# Patient Record
Sex: Female | Born: 1985 | Race: White | Hispanic: No | Marital: Single | Smoking: Current every day smoker
Health system: Southern US, Community
[De-identification: ages and names within clinical notes are randomized; demographics above are authoritative.]

## PROBLEM LIST (undated history)

## (undated) ENCOUNTER — Emergency Department (HOSPITAL_COMMUNITY): Discharge status: Absent without leave | Payer: Medicare Other

## (undated) DIAGNOSIS — I10 Essential (primary) hypertension: Secondary | ICD-10-CM

## (undated) DIAGNOSIS — F319 Bipolar disorder, unspecified: Secondary | ICD-10-CM

## (undated) DIAGNOSIS — G4730 Sleep apnea, unspecified: Secondary | ICD-10-CM

## (undated) DIAGNOSIS — E7800 Pure hypercholesterolemia, unspecified: Secondary | ICD-10-CM

## (undated) HISTORY — PX: TONSILLECTOMY: SUR1361

---
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
## 2004-02-03 ENCOUNTER — Inpatient Hospital Stay (HOSPITAL_COMMUNITY): Admission: AD | Admit: 2004-02-03 | Discharge: 2004-02-10 | Payer: Self-pay | Admitting: Psychiatry

## 2010-01-19 ENCOUNTER — Emergency Department (HOSPITAL_COMMUNITY): Admission: EM | Admit: 2010-01-19 | Discharge: 2010-01-21 | Payer: Self-pay | Admitting: Emergency Medicine

## 2010-11-15 ENCOUNTER — Emergency Department (HOSPITAL_COMMUNITY): Payer: Medicare Other

## 2010-11-15 ENCOUNTER — Inpatient Hospital Stay (HOSPITAL_COMMUNITY)
Admission: EM | Admit: 2010-11-15 | Discharge: 2010-11-18 | DRG: 683 | Disposition: A | Discharge status: Home / Self Care | Payer: Medicare Other | Attending: Internal Medicine | Admitting: Internal Medicine

## 2010-11-15 DIAGNOSIS — E785 Hyperlipidemia, unspecified: Secondary | ICD-10-CM | POA: Diagnosis present

## 2010-11-15 DIAGNOSIS — T438X5A Adverse effect of other psychotropic drugs, initial encounter: Secondary | ICD-10-CM | POA: Diagnosis present

## 2010-11-15 DIAGNOSIS — N179 Acute kidney failure, unspecified: Principal | ICD-10-CM | POA: Diagnosis present

## 2010-11-15 DIAGNOSIS — F17200 Nicotine dependence, unspecified, uncomplicated: Secondary | ICD-10-CM | POA: Diagnosis present

## 2010-11-15 DIAGNOSIS — T360X5A Adverse effect of penicillins, initial encounter: Secondary | ICD-10-CM | POA: Diagnosis present

## 2010-11-15 DIAGNOSIS — N390 Urinary tract infection, site not specified: Secondary | ICD-10-CM | POA: Diagnosis present

## 2010-11-15 DIAGNOSIS — L270 Generalized skin eruption due to drugs and medicaments taken internally: Secondary | ICD-10-CM | POA: Diagnosis present

## 2010-11-15 DIAGNOSIS — K5289 Other specified noninfective gastroenteritis and colitis: Secondary | ICD-10-CM | POA: Diagnosis present

## 2010-11-15 DIAGNOSIS — D72829 Elevated white blood cell count, unspecified: Secondary | ICD-10-CM | POA: Diagnosis present

## 2010-11-15 DIAGNOSIS — E860 Dehydration: Secondary | ICD-10-CM | POA: Diagnosis present

## 2010-11-15 DIAGNOSIS — R7402 Elevation of levels of lactic acid dehydrogenase (LDH): Secondary | ICD-10-CM | POA: Diagnosis present

## 2010-11-15 DIAGNOSIS — E8881 Metabolic syndrome: Secondary | ICD-10-CM | POA: Diagnosis present

## 2010-11-15 DIAGNOSIS — I959 Hypotension, unspecified: Secondary | ICD-10-CM | POA: Diagnosis present

## 2010-11-15 DIAGNOSIS — R7401 Elevation of levels of liver transaminase levels: Secondary | ICD-10-CM | POA: Diagnosis present

## 2010-11-15 DIAGNOSIS — F319 Bipolar disorder, unspecified: Secondary | ICD-10-CM | POA: Diagnosis present

## 2010-11-15 DIAGNOSIS — Z79899 Other long term (current) drug therapy: Secondary | ICD-10-CM

## 2010-11-15 LAB — POCT I-STAT, CHEM 8
Calcium, Ion: 1.17 mmol/L (ref 1.12–1.32)
Glucose, Bld: 92 mg/dL (ref 70–99)
HCT: 34 % — ABNORMAL LOW (ref 36.0–46.0)
Hemoglobin: 11.6 g/dL — ABNORMAL LOW (ref 12.0–15.0)
Potassium: 5.4 mEq/L — ABNORMAL HIGH (ref 3.5–5.1)

## 2010-11-15 LAB — HEPATIC FUNCTION PANEL
AST: 79 U/L — ABNORMAL HIGH (ref 0–37)
Bilirubin, Direct: 0.2 mg/dL (ref 0.0–0.3)
Indirect Bilirubin: 0.6 mg/dL (ref 0.3–0.9)
Total Protein: 5.9 g/dL — ABNORMAL LOW (ref 6.0–8.3)

## 2010-11-15 LAB — BASIC METABOLIC PANEL
BUN: 27 mg/dL — ABNORMAL HIGH (ref 6–23)
CO2: 24 mEq/L (ref 19–32)
Chloride: 106 mEq/L (ref 96–112)
Creatinine, Ser: 2.56 mg/dL — ABNORMAL HIGH (ref 0.4–1.2)
GFR calc Af Amer: 27 mL/min — ABNORMAL LOW (ref >60)

## 2010-11-15 LAB — CARBAMAZEPINE LEVEL, TOTAL: Carbamazepine Lvl: 3.2 ug/mL — ABNORMAL LOW (ref 4.0–12.0)

## 2010-11-15 LAB — URINALYSIS, ROUTINE W REFLEX MICROSCOPIC
Ketones, ur: 15 mg/dL — AB
Nitrite: POSITIVE — AB
Urobilinogen, UA: 1 mg/dL (ref 0.0–1.0)

## 2010-11-15 LAB — CBC
Hemoglobin: 10.4 g/dL — ABNORMAL LOW (ref 12.0–15.0)
MCH: 30 pg (ref 26.0–34.0)
MCV: 93.1 fL (ref 78.0–100.0)
RBC: 3.47 MIL/uL — ABNORMAL LOW (ref 3.87–5.11)

## 2010-11-15 LAB — DIFFERENTIAL
Lymphocytes Relative: 17 % (ref 12–46)
Lymphs Abs: 2.4 10*3/uL (ref 0.7–4.0)
Monocytes Relative: 10 % (ref 3–12)
Neutro Abs: 8.4 10*3/uL — ABNORMAL HIGH (ref 1.7–7.7)
Neutrophils Relative %: 58 % (ref 43–77)

## 2010-11-15 LAB — POCT PREGNANCY, URINE: Preg Test, Ur: NEGATIVE

## 2010-11-16 ENCOUNTER — Emergency Department (HOSPITAL_COMMUNITY): Payer: Medicare Other

## 2010-11-16 DIAGNOSIS — F319 Bipolar disorder, unspecified: Secondary | ICD-10-CM

## 2010-11-16 LAB — LITHIUM LEVEL: Lithium Lvl: 1.58 mEq/L (ref 0.80–1.40)

## 2010-11-16 LAB — BASIC METABOLIC PANEL
BUN: 19 mg/dL (ref 6–23)
Chloride: 106 mEq/L (ref 96–112)
GFR calc non Af Amer: 46 mL/min — ABNORMAL LOW (ref >60)
Glucose, Bld: 108 mg/dL — ABNORMAL HIGH (ref 70–99)
Potassium: 4.9 mEq/L (ref 3.5–5.1)

## 2010-11-16 LAB — CBC
HCT: 33.1 % — ABNORMAL LOW (ref 36.0–46.0)
MCV: 92.5 fL (ref 78.0–100.0)
RDW: 14.3 % (ref 11.5–15.5)
WBC: 12.2 10*3/uL — ABNORMAL HIGH (ref 4.0–10.5)

## 2010-11-16 LAB — HEPATITIS PANEL, ACUTE
HCV Ab: NEGATIVE
Hep A IgM: NEGATIVE
Hepatitis B Surface Ag: NEGATIVE

## 2010-11-16 LAB — CARDIAC PANEL(CRET KIN+CKTOT+MB+TROPI)
CK, MB: 0.5 ng/mL (ref 0.3–4.0)
Relative Index: INVALID (ref 0.0–2.5)
Total CK: 16 U/L (ref 7–177)
Troponin I: 0.02 ng/mL (ref 0.00–0.06)

## 2010-11-16 LAB — CK TOTAL AND CKMB (NOT AT ARMC)
Relative Index: INVALID (ref 0.0–2.5)
Total CK: 14 U/L (ref 7–177)

## 2010-11-16 NOTE — H&P (Signed)
NAMEABBYGAEL, CURTISS NO.:  [id number]  MEDICAL RECORD NO.:  [id number]           PATIENT TYPE:  E  LOCATION:  [facility]                         FACILITY:  [facility]  PHYSICIAN:  Erick Blinks, MD     DATE OF BIRTH:  Nov 14, 1985  DATE OF ADMISSION:  11/15/2010 DATE OF DISCHARGE:                             HISTORY & PHYSICAL   PRIMARY CARE PHYSICIAN:  [facility] in [place], [place].  CHIEF COMPLAINT:  Persistent nausea and vomiting.  HISTORY OF PRESENT ILLNESS:  This is a 25 year old female with past medical history of bipolar disorder, depression, morbid obesity as well as tobacco abuse who presents to the emergency room with complaints of 2- week history of nausea, vomiting, and diarrhea.  The patient reports that approximately 2 weeks ago, she was in her normal state of health when she developed onset of persistent nausea with vomiting.  She vomits approximately 2-3 times a day.  She reports that she throws up even when she does not eat any food. The vomitus is yellow in color without any blood.  She also reports having diarrhea, mostly occurring at night, having 5-6 episodes at night.  This has also been for the past week. She denies any abdominal pain.  She reports that her urine is dark in color, but has been having an adequate amount of urine output.  She denies any dysuria.  She has a chronic cough which has not really changed.  She has no shortness of breath.  She denies any fever, changes in vision, headache, confusion, somnolence.  She does have increased fatigue and decreased p.o. intake.  She reports feeling dizzy when standing up.  Denies any chest pain or palpitations.  She is a resident of a group home and due to the constellation of the symptoms, she was sent to the ER for evaluation.  In the emergency room, the patient was found to be in acute renal failure with a creatinine of 3.2.  She was started on IV fluids and had a  significant improvement with this.  Her creatinine has improved to 1.38.  She was monitored overnight in the CDU with hopes of discharge today after aggressive hydration, but due to the persistence of her symptoms, as well as an elevated lithium level of 1.7, the patient has been referred for admission.  Of note, she has also been persistently hypotensive during her stay here in the emergency room with blood pressures ranging between the 80s to 90s.  PAST MEDICAL HISTORY: 1. Bipolar disorder. 2. Hyperlipidemia. 3. Depression. 4. Tobacco abuse. 5. Morbid obesity.  ALLERGIES:  No known drug allergies.  MEDICATIONS PRIOR TO ADMISSION: 1. BuSpar. 2. Celexa. 3. Lipitor. 4. Lithium 5. Tegretol. 6. Trazodone. 7. Unknown blood pressure medication.  FAMILY HISTORY:  The patient's mother and father are alive and have no significant past medical history.  SOCIAL HISTORY:  The patient smokes about 7 cigarettes a day.  She denies any alcohol or drug abuse.  She lives at a group home.  REVIEW OF SYSTEMS:  All systems have been reviewed and pertinent positives stated in the  HPI.  PHYSICAL EXAMINATION:  VITAL SIGNS:  Temperature 97, blood pressure 98/56, heart rate of 85, respiratory rate of 18, pulse ox is 98% on room air. GENERAL:  The patient in no acute distress, lying comfortably in bed. HEENT:  Normocephalic, atraumatic.  Pupils are equal, round, reactive to light without any evidence of nystagmus. NECK:  Supple. CHEST:  Clear to auscultation bilaterally. CARDIAC:  S1 and S2 with a slight tachycardia. ABDOMEN:  Obese, soft, nontender. EXTREMITIES:  No significant pedal edema.  There is a petechial rash on the lower extremities involving the shins and the feet bilaterally.  The patient reports having this for approximately greater than 2 weeks now. NEUROLOGIC:  The patient has equal strength bilaterally.  Cranial nerves II through XII are grossly intact.  Plantars are  downgoing.  LABORATORY DATA:  Lithium level of 1.7.  Sodium 135, potassium 4.9, chloride 106, bicarb 22, glucose 108, BUN 19, creatinine 1.38, calcium 9.2, WBC 12.2, hemoglobin 10.7, platelet count 275, lactic acid of 1.4. Urinalysis shows 11-20 wbc's and many bacteria.  Urine pregnancy test is negative.  Tegretol level was 3.2.  Lipase is normal at 23.  Liver enzymes slightly elevated with an ALT of 162, AST of 79, alkaline phos is 165.  Liver ultrasound shows no abdominal pathology was demonstrated on an abdominal ultrasound.  ASSESSMENT AND PLAN: 1. Lithium toxicity. 2. Acute renal failure, improving. 3. Urinary tract infection. 4. Hypotension with question underlying sepsis. 5. Bipolar. 6. Morbid obesity. 7. Tobacco abuse. 8. Hyperlipidemia. 9. Leukocytosis. 10.Transaminitis. 11.Full code.  PLAN: 1. We will admit the patient to the step-down unit due to her    low blood pressure.  She will likely benefit from being     monitored there for at least 24 hours to make sure her blood     pressure is stable after which she can be transferred to the floor.     We will continue her with IV hydration.  The emergency room     physicians have contacted the renal service and it is felt that     patient does not require dialysis at this time.  This was also     discussed with Poison Control who agree that dialysis is not     emergently needed, but a lithium level should be monitored,     therefore we will repeat one in the morning.  Regarding her urinary     tract infection, we will treat her empirically with Rocephin and     follow up on urine culture.  Her nausea and vomiting is likely     secondary to her lithium levels.  We will keep her on a regular     diet and provide antiemetics.  Her hypotension may also be related     to her lithium level, although infection may be playing a role here     as well.  We will also check a TSH as well as serum cortisol level. 2. Transaminitis.   We will repeat levels in the morning, but this is     likely secondary to hypotension and acute illness.  Her abdominal     ultrasound is negative, but we will check an acute hepatitis panel     in light of her nausea and vomiting. 3. Leukocytosis, again possibly stress-related versus secondary to     urinary tract infection.  We will also check chest x-ray to rule     out any underlying infection.  For tobacco  abuse, we will ask for     tobacco cessation consult.  Regarding her bipolar medications, we     will hold her lithium due to toxicity and we will ask Psychiatry to     see her in consultation since she may need to be started on a new     agent in place of lithium.  The patient is a full code.  Further     orders will be per the clinical course.     Erick Blinks, MD     JM/MEDQ  D:  11/16/2010  T:  11/16/2010  Job:  [id number]  Electronically Signed by Durward Mallard MEMON  on 11/16/2010 07:33:50 PM

## 2010-11-17 DIAGNOSIS — F319 Bipolar disorder, unspecified: Secondary | ICD-10-CM

## 2010-11-17 LAB — COMPREHENSIVE METABOLIC PANEL
BUN: 16 mg/dL (ref 6–23)
CO2: 24 mEq/L (ref 19–32)
Calcium: 9.4 mg/dL (ref 8.4–10.5)
Creatinine, Ser: 0.95 mg/dL (ref 0.4–1.2)
GFR calc non Af Amer: >60 mL/min (ref >60)
Glucose, Bld: 134 mg/dL — ABNORMAL HIGH (ref 70–99)

## 2010-11-17 LAB — GLUCOSE, CAPILLARY

## 2010-11-17 LAB — RAPID URINE DRUG SCREEN, HOSP PERFORMED
Amphetamines: NOT DETECTED
Barbiturates: NOT DETECTED
Opiates: NOT DETECTED

## 2010-11-17 LAB — LIPID PANEL
Total CHOL/HDL Ratio: 7.5 RATIO
Triglycerides: 215 mg/dL — ABNORMAL HIGH (ref <150)
VLDL: 43 mg/dL — ABNORMAL HIGH (ref 0–40)

## 2010-11-17 LAB — CBC
HCT: 31.2 % — ABNORMAL LOW (ref 36.0–46.0)
Hemoglobin: 10 g/dL — ABNORMAL LOW (ref 12.0–15.0)
MCH: 29.9 pg (ref 26.0–34.0)
MCHC: 32.1 g/dL (ref 30.0–36.0)
MCV: 93.1 fL (ref 78.0–100.0)

## 2010-11-17 LAB — CORTISOL: Cortisol, Plasma: 1.3 ug/dL

## 2010-11-17 LAB — CARDIAC PANEL(CRET KIN+CKTOT+MB+TROPI)
CK, MB: 0.5 ng/mL (ref 0.3–4.0)
Total CK: 17 U/L (ref 7–177)
Troponin I: 0.03 ng/mL (ref 0.00–0.06)

## 2010-11-17 NOTE — Consult Note (Signed)
[name], Jessica Rios NO.:  [id number]  MEDICAL RECORD NO.:  [id number]           PATIENT TYPE:  E  LOCATION:  [facility]                         FACILITY:  [facility]  PHYSICIAN:  Anselm Jungling, MD  DATE OF BIRTH:  1986-02-11  DATE OF CONSULTATION:  11/16/2010 DATE OF DISCHARGE:                                CONSULTATION   IDENTIFICATION DATA AND REASON FOR REFERRAL:  The patient is a 25 year old single Caucasian female, currently under the care of Dr. Effie Shy here at [facility] Emergency Department.  Psychiatric consultation is requested to assess mental status and make recommendations regarding her medication for bipolar disorder.  HISTORY OF THE PRESENTING PROBLEMS:  The following information is obtained from the medical record and from the patient Rios, who was able to provide some useful information.  The patient was admitted with a chief complaint of nausea, vomiting, and diarrhea.  She has subsequently been found to have leukocytosis, hypotension, lithium toxicity with a level of 1.75, possibly urinary tract infection and rule out sepsis.  She also has some transaminitis. There is a history of morbid obesity and acute renal failure.  Jessica Rios tells me that she lives in a group home, and is on a regimen of psychotropic medications that include BuSpar, trazodone, lithium carbonate, and Celexa.  She states that the psychiatrist that prescribes these as a new one to her and she does not know the doctor's name.  However, she indicates that she has been taking her medications as directed.  (At the time of this dictation, we are awaiting medication reconciliation regarding the specific doses of these above medications).  The patient indicates that she has had a history of psychiatric hospitalization on many different occasions in her life, but the last time she was hospitalized within the [facility] was probably more than 7 or 8 years ago.  She  indicates that recently she has been stable with respect to her bipolar disorder, without any symptoms of mood disturbance, or psychosis.  Although she currently lives in a certain group home, she is apparently given 2 weeks notice there and will be moving to a different facility in the near future.  Her mother is apparently local and is supportive.  MENTAL STATUS AND OBSERVATIONS:  The patient is a morbidly obese, red- headed young woman, who I interviewed in her emergency room bed.  She is alert, fully oriented, and in full contact with reality.  She was pleasant, cooperative.  Mood appears to be neutral with appropriate affect.  Thoughts and speech are normally organized.  There is nothing to suggest any active psychosis, thought disorder, or cognitive disturbance.  She was able to provide me the names, but not the doses of her psychotropic medications.  She indicated that her mood is neutral recently and she has no complaints regarding symptoms of bipolar disorder, either cognitive, behavioral, or affective.  She smiled appropriately at times, and interacted with me and entirely rational, conventional, and appropriate fashion.  IMPRESSIONS:  Axis I:  Bipolar affective disorder, currently euthymic without psychotic features. Axis II:  Deferred. Axis III:  Multiple  medical problems, lithium toxicity. Axis IV:  Stressors severe. Axis V:  GAF 65.  RECOMMENDATIONS:  At this time, I agree with the current hold on her lithium, until the etiology of her lithium toxicity can be determined and her lithium level drops below the toxic range.  Most likely, her lithium toxicity is consequence of her dehydration and associated electrolyte disturbance, which in turn may be related to acute medical conditions including infection.  For now, it might be reasonable to hold BuSpar, Celexa, and trazodone, although they are not likely to be a problem in terms of her other current medical  conditions.  It would make sense to get her precise psychotropic medication regimen recorded into the medical record.  Then, when her medical problems are stabilized with respect to renal function, lithium level, hydration, electrolyte balance, etc., consideration can be given to reinitiating her psychotropic regimen.  Psychiatry will be happy to follow her during her hospital course.  Thank you for referring this most interesting patient.     Anselm Jungling, MD     SPB/MEDQ  D:  11/16/2010  T:  11/16/2010  Job:  [id number]  Electronically Signed by Geralyn Flash MD on 11/17/2010 10:37:37 AM

## 2010-11-18 LAB — COMPREHENSIVE METABOLIC PANEL
AST: 44 U/L — ABNORMAL HIGH (ref 0–37)
BUN: 9 mg/dL (ref 6–23)
CO2: 24 mEq/L (ref 19–32)
Calcium: 9.3 mg/dL (ref 8.4–10.5)
Chloride: 109 mEq/L (ref 96–112)
Creatinine, Ser: 0.9 mg/dL (ref 0.4–1.2)
GFR calc Af Amer: >60 mL/min (ref >60)
GFR calc non Af Amer: >60 mL/min (ref >60)
Glucose, Bld: 101 mg/dL — ABNORMAL HIGH (ref 70–99)
Total Bilirubin: 0.5 mg/dL (ref 0.3–1.2)

## 2010-11-18 LAB — CBC
Hemoglobin: 10.4 g/dL — ABNORMAL LOW (ref 12.0–15.0)
MCH: 30.1 pg (ref 26.0–34.0)
MCHC: 32.2 g/dL (ref 30.0–36.0)
MCV: 93.6 fL (ref 78.0–100.0)

## 2010-11-18 LAB — DIFFERENTIAL
Basophils Relative: 2 % — ABNORMAL HIGH (ref 0–1)
Lymphs Abs: 3.3 10*3/uL (ref 0.7–4.0)
Monocytes Absolute: 1.7 10*3/uL — ABNORMAL HIGH (ref 0.1–1.0)
Monocytes Relative: 12 % (ref 3–12)
Neutro Abs: 6.6 10*3/uL (ref 1.7–7.7)

## 2010-11-18 LAB — URINE CULTURE
Colony Count: 35000
Culture  Setup Time: 201202172206

## 2010-11-18 LAB — GLUCOSE, CAPILLARY
Glucose-Capillary: 113 mg/dL — ABNORMAL HIGH (ref 70–99)
Glucose-Capillary: 132 mg/dL — ABNORMAL HIGH (ref 70–99)

## 2010-11-28 NOTE — Discharge Summary (Signed)
Jessica Rios, Jessica Rios                ACCOUNT NO.:  [id number]  MEDICAL RECORD NO.:  [id number]           PATIENT TYPE:  I  LOCATION:  3705                         FACILITY:  [facility]  PHYSICIAN:  Erick Blinks, MD     DATE OF BIRTH:  [birth year]  DATE OF ADMISSION:  11/15/2010 DATE OF DISCHARGE:                        DISCHARGE SUMMARY - REFERRING   PRIMARY CARE PHYSICIAN:  [facility] in [place].  DISCHARGE DIAGNOSES: 1. Dehydration, improved. 2. Nausea, vomiting, and diarrhea possibly viral gastroenteritis,     resolved. 3. Acute renal failure secondary to nausea, resolved with IV fluids. 4. Urinary tract infection. 5. Lithium toxicity, improved. 6. History of bipolar disorder. 7. Allergic dermatitis secondary to recent penicillin use. 8. Leukocytosis secondary to allergies. 9. Transaminitis possibly secondary to hypotension, improving. 10.Morbid obesity. 11.Metabolic syndrome.  DISCHARGE MEDICATIONS: 1. Ciprofloxacin 500 mg p.o. b.i.d. for 3 more days. 2. Prednisone taper. 3. BuSpar 15 mg 1 tablet by mouth 3 times a day. 4. Carbamazepine XR 100 mg 1 tablet by mouth twice daily. 5. Celexa 40 mg 1 tablet by mouth daily. 6. Lithium carbonate CR 450 mg tablets, 2.5 tablets by mouth daily at     bedtime. 7. Meclizine 25 mg 1-2 tablets by mouth 3 times a day as needed. 8. Metformin XR 500 mg 1 tablet by mouth twice daily. 9. Vitamin B12 1000 mcg 1 tablet by mouth daily.  MEDICATIONS STOPPED IN THE HOSPITAL: 1. Lisinopril 10 mg daily secondary to renal failure. 2. Lipitor 20 mg daily secondary to elevated liver enzymes.  ADMISSION HISTORY:  This is a 25 year old female who is a resident of group home who was brought to the emergency room with complaints of nausea, vomiting, and diarrhea.  Here in the emergency room, she was found to be in acute renal failure and had an elevated lithium level. She was also found to be hypotensive in the 70s-80s.  She  was aggressively hydrated with IV fluids and was admitted to the hospital for observation.  For further details, please refer to history and physical dictated on February 16.  HOSPITAL COURSE: 1. Acute renal failure.  This was secondary to dehydration and has     improved back to baseline with aggressive IV hydration. 2. Dehydration, possibly secondary to viral gastroenteritis.  The     patient is currently tolerating p.o.  She is not having any further     nausea, vomiting, or diarrhea.  Her blood pressure is still     normotensive to little bit on the lower side and therefore we would     not restart her lisinopril and for this concern, would be revisited     as an outpatient. 3. Elevated liver enzymes.  The patient had liver ultrasound which was     negative.  She also had a negative hepatitis panel.  This may     possibly due to hypotension.  Her liver enzymes are currently     trending down and can be further followed as an outpatient.  We     will not restart her Lipitor until her liver enzymes have  normalized and should be rechecked in the next week with her     primary care doctor. 4. Bipolar disorder.  The patient was seen in consultation by Dr.     Electa Sniff.  It was felt that the current regimen of her medication     should be continued and no changes were made in her psychiatric     medication. 5. Allergic dermatitis.  The patient was found to have diffuse rash on     her legs as well as her arms.  She notices since she has been     taking her penicillin that was prescribed by an ophthalmologist.     Her leukocytosis is likely secondary to her rash with an elevated     eosinophil differential.  We will give her a course of prednisone     and she can have further followup with primary care physician.  CONSULTATIONS:  Psychiatry, Dr. Electa Sniff.  PROCEDURES:  None.  DIAGNOSTIC IMAGING: 1. Ultrasound of the abdomen on February 15 shows no abdominal     pathology was  demonstrated. 2. Chest x-ray from February 16 shows cardiomegaly, bronchitic change.  DISCHARGE INSTRUCTIONS:  The patient should continue on a low-calorie diet, conduct her activity as tolerated.  She will need to follow up with her primary care physician within next 1 week and have repeat blood work including liver function tests as well as renal function.  She will also follow up with psychologist/psychiatrist as an outpatient.  She will be discharged to a group home and will be transported by her family.  The patient is otherwise stable for discharge at this point.     Erick Blinks, MD     JM/MEDQ  D:  11/18/2010  T:  11/18/2010  Job:  [id number]  Electronically Signed by Durward Mallard Mareesa Gathright  on 11/28/2010 05:51:10 PM

## 2010-12-07 ENCOUNTER — Emergency Department: Payer: Self-pay

## 2010-12-15 ENCOUNTER — Emergency Department (HOSPITAL_BASED_OUTPATIENT_CLINIC_OR_DEPARTMENT_OTHER)
Admission: EM | Admit: 2010-12-15 | Discharge: 2010-12-15 | Disposition: A | Discharge status: Home / Self Care | Payer: Medicare Other | Attending: Emergency Medicine | Admitting: Emergency Medicine

## 2010-12-15 DIAGNOSIS — Z79899 Other long term (current) drug therapy: Secondary | ICD-10-CM | POA: Insufficient documentation

## 2010-12-15 DIAGNOSIS — L03319 Cellulitis of trunk, unspecified: Secondary | ICD-10-CM | POA: Insufficient documentation

## 2010-12-15 DIAGNOSIS — F17200 Nicotine dependence, unspecified, uncomplicated: Secondary | ICD-10-CM | POA: Insufficient documentation

## 2010-12-15 DIAGNOSIS — L02219 Cutaneous abscess of trunk, unspecified: Secondary | ICD-10-CM | POA: Insufficient documentation

## 2010-12-15 DIAGNOSIS — F319 Bipolar disorder, unspecified: Secondary | ICD-10-CM | POA: Insufficient documentation

## 2010-12-19 LAB — CBC
HCT: 42.4 % (ref 36.0–46.0)
Hemoglobin: 14.2 g/dL (ref 12.0–15.0)
MCV: 92.2 fL (ref 78.0–100.0)
WBC: 19.5 10*3/uL — ABNORMAL HIGH (ref 4.0–10.5)

## 2010-12-19 LAB — URINALYSIS, ROUTINE W REFLEX MICROSCOPIC
Glucose, UA: NEGATIVE mg/dL
Ketones, ur: NEGATIVE mg/dL
Leukocytes, UA: NEGATIVE
Nitrite: NEGATIVE
Protein, ur: 30 mg/dL — AB
pH: 5.5 (ref 5.0–8.0)

## 2010-12-19 LAB — COMPREHENSIVE METABOLIC PANEL
Alkaline Phosphatase: 56 U/L (ref 39–117)
BUN: 8 mg/dL (ref 6–23)
Chloride: 107 mEq/L (ref 96–112)
Creatinine, Ser: 1.04 mg/dL (ref 0.4–1.2)
GFR calc non Af Amer: >60 mL/min (ref >60)
Glucose, Bld: 124 mg/dL — ABNORMAL HIGH (ref 70–99)
Potassium: 3.7 mEq/L (ref 3.5–5.1)
Total Bilirubin: 0.5 mg/dL (ref 0.3–1.2)

## 2010-12-19 LAB — PREGNANCY, URINE: Preg Test, Ur: NEGATIVE

## 2010-12-19 LAB — URINE MICROSCOPIC-ADD ON

## 2010-12-19 LAB — POCT I-STAT, CHEM 8
Calcium, Ion: 1.16 mmol/L (ref 1.12–1.32)
Chloride: 107 mEq/L (ref 96–112)
HCT: 46 % (ref 36.0–46.0)
Potassium: 3.8 mEq/L (ref 3.5–5.1)
Sodium: 142 mEq/L (ref 135–145)

## 2010-12-19 LAB — RAPID URINE DRUG SCREEN, HOSP PERFORMED
Barbiturates: NOT DETECTED
Benzodiazepines: NOT DETECTED

## 2010-12-19 LAB — DIFFERENTIAL
Basophils Absolute: 0.1 10*3/uL (ref 0.0–0.1)
Basophils Relative: 0 % (ref 0–1)
Lymphocytes Relative: 21 % (ref 12–46)
Neutro Abs: 14.1 10*3/uL — ABNORMAL HIGH (ref 1.7–7.7)
Neutrophils Relative %: 72 % (ref 43–77)

## 2010-12-19 LAB — ETHANOL: Alcohol, Ethyl (B): <5 mg/dL (ref 0–10)

## 2010-12-19 LAB — ACETAMINOPHEN LEVEL: Acetaminophen (Tylenol), Serum: <10 ug/mL — ABNORMAL LOW (ref 10–30)

## 2011-01-26 ENCOUNTER — Emergency Department: Payer: Self-pay | Admitting: Emergency Medicine

## 2011-01-31 ENCOUNTER — Inpatient Hospital Stay: Payer: Self-pay | Admitting: Psychiatry
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
## 2011-02-16 NOTE — Discharge Summary (Signed)
NAMECLYDENE, BURACK NO.:  [id number]   MEDICAL RECORD NO.:  [id number]                   PATIENT TYPE:  IPS   LOCATION:  0302                                 FACILITY:  [facility]   PHYSICIAN:  Jeanice Lim, M.D.              DATE OF BIRTH:  14-Apr-1986   DATE OF ADMISSION:  12/01/2002  DATE OF DISCHARGE:  12/06/2002                                 DISCHARGE SUMMARY   IDENTIFYING DATA:  This is an 25 year old Caucasian female, married,  voluntarily admitted with a history of post partum depression.  She was  hospitalized at [facility] for six days February 17 with suicidal thoughts  and agitation at that time.  Parents took her back due to her not improving  and had her stop her medications three days after discharge from the  hospital, mother feeling that these medicines maybe making her worse.  Also,  mother and the patient felt that an IUD, which had been inserted, may be  causing many of her symptoms.   MEDICATIONS:  The patient had been discharged from [facility] on:  1. Klonopin.  2. Lexapro.  3. Risperdal.   DRUG ALLERGIES:  EFFEXOR, which causes her to act strange.   PHYSICAL EXAMINATION:  GENERAL: Essentially within normal limits.  NEUROLOGIC: Nonfocal.   LABORATORY DATA:  Routine admission labs were essentially within normal  limits.  CBC was normal except for mildly elevated white count of 10.9.  TSH  was 0.55.   MENTAL STATUS EXAM:  obese, agitated, mildly sedated appearing female, quite  tearful, convinced IUD was causing physical problems, having episodic panic  attacks, focused on having the IUD removed, episodically anxious and  agitated, hyperventilating, describing depressive symptoms, panic attacks,  and some disordered thinking with possible somatic delusions.  Cognitive:  Intact.  Judgment and insight: Fair.   ADMISSION DIAGNOSES:   AXIS I:  1. Major depressive disorder, recurrent, severe with psychotic features.  2.  Polysubstance abuse in remission.   AXIS II:  None.   AXIS III:  Two months post partum.   AXIS IV:  Moderate problems with primary support group and stress related to  pregnancy and marriage.   AXIS V:  20/60   HOSPITAL COURSE:  The patient was admitted, ordered routine p.r.n.  medications, underwent further monitoring, and was encouraged to participate  in individual, group, and milieu therapy.  Family was involved in treatment  plan and aftercare planning.  The patient was educated regarding the  importance of her compliance with medications and the time period necessary  for an adequate therapeutic trial.  She was resumed on Klonopin and  Risperdal was optimized, targeting psychotic symptoms and agitated  depression.  Lexapro was also optimized.  The patient reported a positive  response to medications and reported no longer wanting the IUD removed,  realizing that she had depression which was treatable if she  would stay on  the medications.   CONDITION ON DISCHARGE:  Her condition on discharge was markedly improved.  Mood was more euthymic.  Affect: Brighter.  Thought processes: Goal  directed.  Thought content: Negative for dangerous ideation or psychotic  symptoms.  The patient reported motivation to be compliant with the  aftercare plan.   DISCHARGE MEDICATIONS:  1. Lexapro 20 mg q.a.m.  2. Risperdal 2 mg q.h.s.  3. Remeron 15 mg q.h.s.   FOLLOW UP:  The patient was to follow up at [facility]  within seven days.   DISCHARGE DIAGNOSES:   AXIS I:  1. Major depressive disorder, recurrent, severe with psychotic features.  2. Polysubstance abuse in remission.   AXIS II:  None.   AXIS III:  Two months post partum.   AXIS IV:  Moderate problems with primary support group and stress related to  pregnancy and marriage.   AXIS V:  Global assessment of functioning on discharge was 55.                                                Jeanice Lim, M.D.    JEM/MEDQ  D:  12/15/2002  T:  12/15/2002  Job:  [id number]

## 2011-02-16 NOTE — H&P (Signed)
[facility]  Patient:    Jessica Rios, Jessica Rios                     MRN: [id number] Adm. Date:  [id number] Attending:  Jasmine Pang CC:         [facility]                   Psychiatric Admission Assessment  IDENTIFICATION:  Almost 25 year old Caucasian female referred by the [facility].  HISTORY OF PRESENT ILLNESS:  Patient has a history of mood disorder with an escalation of depression.  She has become increasingly anergic with anhedonia, difficulty concentrating, feelings of hopelessness and worthlessness and suicidal ideation.  On the day of admission she threatened to overdose on Neurontin or run away.  She has had an overdose on medications within the past several months.  PAST PSYCHIATRIC HISTORY:  Patient was seen at [facility].  Her diagnoses were bipolar disorder and depression, according to patient.  She is on Neurontin 600 mg p.o. t.i.d.  She has been hospitalized at [facility] x 1.  She has been on the [place] psychiatric unit x 2.  She has been at [facility] x 1.  SUBSTANCE ABUSE HISTORY:  Patient uses marijuana daily.  She uses alcohol occasionally.  PAST MEDICAL HISTORY:  There were no medical problems.  ALLERGIES:  Patient states she is allergic to [facility] ("makes me go crazy"); however, there are no other known drug allergies.  MEDICATIONS:  She is on no medications other than Neurontin.  SOCIAL HISTORY:  Patient lives with her mother, father, and 30 year old sister.  She is in the 9th grade and states she is making Cs or above.  She denies a history of physical or sexual abuse.  FAMILY PSYCHIATRIC HISTORY:  Negative as reported upon admission.  This will be further evaluated by our social worker.  LEGAL HISTORY:  None.  ADMISSION MENTAL STATUS EXAMINATION:  Patient presented as a reserved Caucasian female with poor eye contact.  Speech was soft and slow.  There  was no articulation disorder.  There was psychomotor retardation.  Mood was depressed and irritable, affect sad, constricted, and consistent with mood.  There was positive suicidal ideation as per history of present illness.  There was no homicidal ideation.  There was no psychosis or perceptual disturbance. Thought processes were logical and goal directed.  Thought content revealed no predominant theme.  Cognitive exam revealed that she was alert and oriented to person, place, time, and reason for being in the hospital.  Short term and long term memory were adequate.  General fund of knowledge was age and education level appropriate.  Attention and concentration were poor as assessed by her distractability.  Insight was minimal, judgment poor.  ADMISSION DIAGNOSES:   AXIS I:  1. Bipolar disorder, depressed.            2. Conduct disorder, adolescent onset.  AXIS II:  Deferred. AXIS III:  Healthy except for moderate obesity.  AXIS IV:  Severe.   AXIS V:  GAF is 10.  PATIENT ASSETS AND STRENGTHS:  Patient is healthy and able to be physically active.  Patient has a supportive family.  Patient has a supportive mental health center treatment team.  PROBLEM:  Mood instability with suicidal ideation.  SHORT TERM TREATMENT GOAL:  Resolution of suicidal ideation.  LONG TERM TREATMENT GOAL:  Resolution of mood instability.  INITIAL PLAN  OF CARE:  Patient will continue Neurontin, will also begin Celexa 10 mg p.o. q.h.s. tonight and 20 mg p.o. q.h.s. to address her depressive symptoms.  Patient will also be involved in unit therapeutic groups and activities.  Patient will be involved in family therapy.  Estimated length of inpatient treatment 5-7 days.  CONDITION NECESSARY FOR DISCHARGE:  Not suicidal.  INITIAL DISCHARGE PLANS:  Patient will return home to live with her family. Follow-up therapy and medication management will be at the [facility]. DD:   02/01/00 TD:  02/04/00 Job: [id number]

## 2011-02-16 NOTE — Discharge Summary (Signed)
[facility]  Patient:    Jessica Rios, Jessica Rios                     MRN: [id number] Adm. Date:  [id number] Disc. Date: [id number] Attending:  Jasmine Pang Dictator:   Carolanne Grumbling, M.D.                           Discharge Summary  PATIENT IDENTIFICATION:  Jessica Rios is a 25 year old female.  INITIAL ASSESSMENT AND DIAGNOSIS:  Jessica Rios was admitted to the service of Dr. Milford Cage.  Jessica Rios had a history of mood instability with an excalation of her depression.  Jessica Rios had become increasingly sad with feelings of loss of energy and loss of interest.  Jessica Rios had trouble concentrating, feelings of hopelessness, worthlessness, and had begun having suicidal thoughts.  On the day of admission, Jessica Rios threatened to take an overdose of Neurontin or to run away.  Jessica Rios had taken an overdose of medications within the last few months.  MENTAL STATUS:  Mental status at the time of the initial evaluation revealed an alert, oriented young woman who made poor eye contact.  Jessica Rios seemed sad and depressed.  Jessica Rios was positive for suicidal ideation per history.  There was no evidence of any psychosis.  Short and long-term memory were intact.  Attention was poor.  Jessica Rios seemed to be at least average intelligence.  Insight was minimal and judgment was poor.  Other pertinent history can be obtained from the psychosocial service summary.  PHYSICAL EXAMINATION:  Physical examination was noncontributory.  ADMITTING DIAGNOSES:   Axis I:    1. Bipolar disorder, depressed.              2. Conduct disorder, adolescent onset.   Axis II:   Deferred.   Axis III:  Healthy except for moderate obesity.   Axis IV:   Severe.   Axis X:    10.  Rios COURSE:  While in the Rios, Jessica Rios initially maintained Jessica Rios did not know why Jessica Rios was here.  Consequently, there was nothing to really work on. Jessica Rios said Jessica Rios did get into arguments with her mother at times, but did not see that as a major issue.  Jessica Rios denied  any suicidal thoughts and could not see that that was a topic worth discussing since Jessica Rios was not having the thoughts. Jessica Rios did have a session with her mother.  They were able to work things out so that at least the rules were clear.  The expectations and consequences were clear.  Jessica Rios, by that time, was saying that Jessica Rios realized that Jessica Rios did have an attitude problem and Jessica Rios needed to work things out with her mother and follow the rules to gain the trust.  Her mother was concerned particularly about the drug use and Jessica Rios admitted to smoking marijuana and said that Jessica Rios was willing to stop that, even though Jessica Rios was defensive in the family session about that, and her was also concerned about her endangering herself when Jessica Rios left the house.  CONDITION AT TIME OF DISCHARGE:  Jessica Rios was saying that Jessica Rios was planning to follow the rules, to not be sneaking out at night, to stop her marijuana use, and Jessica Rios denied any suicidal thoughts during the Rios stay and at the time of discharge.  POST Rios CARE PLANS:  Jessica Rios is referred back to her outpatient counselor, Mollie Germany and Dr. Chestine Spore at the [facility]  with an appointment with Dr. Chestine Spore on May 18.  DISCHARGE MEDICATIONS:  At the time of discharge he was taking: 1. Celexa 20 mg at bedtime. 2. Neurontin 600 mg three times daily.  There were no restrictions placed on her activity or her diet.  FINAL DIAGNOSES:   Axis I:    1. Bipolar disorder, depressed.              2. Conduct disorder, adolescent onset.              3. Polysubstance abuse.   Axis II:   No diagnosis.   Axis III:  Moderate obesity.   Axis IV:   Severe.   Axis X:    50. DD:  02/07/00 TD:  02/12/00 Job: [id number]

## 2011-02-16 NOTE — Discharge Summary (Signed)
NAMEMarland Rios  [name], [name] NO.:  [id number]   MEDICAL RECORD NO.:  [id number]                   PATIENT TYPE:  IPS   LOCATION:  0407                                 [facility]   PHYSICIAN:  Jeanice Lim, M.D.              DATE OF BIRTH:  [birth year]   DATE OF ADMISSION:  02/03/2004  DATE OF DISCHARGE:  02/10/2004                                 DISCHARGE SUMMARY   IDENTIFYING DATA:  This is a 25 year old married Caucasian female  involuntarily committed.  History of psychosis.  Recently discharged from  [facility].  Decompensating over the past three weeks, feeling  people were calling her a child molester, having trouble with children in  the neighborhood calling her names, wanting to beat her.  The patient may  have threatened her mother verbally as per commitment papers.  The patient  endorsed compliance with medications and visual hallucinations but she was  unable to described first admission to [facility].   PAST PSYCHIATRIC HISTORY:  Had been hospitalized at [facility] April of  2005.  No outpatient treatment.  No history of suicide attempts.  Has  therapist, [place], in [place].   PRIMARY CARE PHYSICIAN:  [facility].   MEDICATIONS:  Zoloft.   ALLERGIES:  No known drug allergies.   PHYSICAL EXAMINATION:  Within normal limits.  Neurologically nonfocal.   LABORATORY DATA:  Routine admission labs within normal limits.   MENTAL STATUS EXAM:  Alert, cooperative female with fair eye contact.  Disheveled.  Some thought-blocking.  Appeared anxious.  Thought processes  disorganized.  Some paranoid thinking.  Endorsing visual hallucinations.  Questionable auditory hallucinations.  Did appear to be responding to  internal stimulus.  Cognitively intact.  Judgment and insight poor and  impaired.  The patient was somewhat vague in being able to report specific  details of past, including the number of years  that she had been married,  which may have been related to thought disorder.   ADMISSION DIAGNOSES:   AXIS I:  1. Psychotic disorder not otherwise specified.  2. Rule out bipolar disorder, type 2, with psychotic features.   AXIS II:  Deferred.   AXIS III:  None.   AXIS IV:  Moderate (psychosocial stressors).   AXIS V:  25/60.   HOSPITAL COURSE:  The patient was admitted and ordered routine p.r.n.  medications and underwent further monitoring.  Was encouraged to participate  in individual, group and milieu therapy.  Was admitted involuntarily for  threatening behavior and psychotic symptoms as per commitment papers.  The  patient was ordered routine p.r.n. medications and stabilized on medications  targeting psychotic symptoms and recent mood instability.  She was continued  on Zoloft and then optimized on Geodon and Seroquel to restore sleep and  Geodon to target psychotic symptoms.  Depakote ER was then added to  stabilize mood and baseline was followed up  by contacting family members.  Depakote was further optimized along with Geodon and low-dose Klonopin for  acute anxiety.  Valproic acid level was monitored.  The patient participated  in aftercare planning and group therapy.   CONDITION ON DISCHARGE:  The patient was discharged to the mother in  improved condition with no dangerous ideation.  No psychotic symptoms.  Mood  more stable.  Improved judgment and insight.  Reported motivation to be  compliant with the aftercare plan and given medication education.   DISCHARGE MEDICATIONS:  1. Ambien 10 mg q.h.s. p.r.n.  2. Depakote ER 250 mg, 2 q.a.m., 1 at 4 p.m. and 4 q.h.s.  3. Geodon 60 mg b.i.d.  4. Seroquel 100 mg, 2 q.h.s.  5. Cogentin 1 mg q.6h. p.r.n. EPS.  6. Klonopin 0.5 mg, 1/2 q.a.m. and 1 at 6 p.m.   FOLLOW UP:  The patient was to follow up with Festus Barren Thursday, Feb 17, 2004 at 7:15 and at [facility].   DISCHARGE DIAGNOSES:   AXIS  I:  1. Psychotic disorder not otherwise specified.  2. Rule out bipolar disorder, type 2, with psychotic features.   AXIS II:  Deferred.   AXIS III:  None.   AXIS IV:  Moderate (psychosocial stressors).   AXIS V:  Global Assessment of Functioning on discharge 55.                                               Jeanice Lim, M.D.    JEM/MEDQ  D:  03/12/2004  T:  03/12/2004  Job:  [id number]

## 2011-02-16 NOTE — Discharge Summary (Signed)
[facility]  Patient:    Jessica Rios                     MRN: [id number] Adm. Date:  [id number] Disc. Date: [id number] Attending:  Jasmine Pang Dictator:   Carolanne Grumbling, M.D.                           Discharge Summary  PATIENT IDENTIFICATION:  Jessica Rios is a 25 year old female.  INITIAL ASSESSMENT AND DIAGNOSIS:  Jessica Rios was admitted to the service of Dr. Milford Cage.  Jessica Rios had had two recent admissions and this was the third in several months.  Jessica Rios stated Jessica Rios had been feeling very depressed, hopeless, and worthless.  Jessica Rios admitted to loss of energy, loss of interest, trouble concentrating.  Jessica Rios was upset because Jessica Rios mother would not let Jessica Rios see Jessica Rios boyfriend.  Jessica Rios also had some conflict with Jessica Rios friends and Jessica Rios took an overdose of Celexa.  MENTAL STATUS:  Mental status at the time of the initial evaluation revealed a cooperative but sullen young woman.  Jessica Rios was disheveled with poor eye contact. Jessica Rios looked depressed and sounded depressed.  Jessica Rios was also angry.  Jessica Rios was positive for suicidal ideation.  There was no evidence of any psychosis. Short- and long-term memory were intact.  Jessica Rios seemed to be at least average intelligence.  Concentration was adequate.  Insight was lacking.  Other pertinent history can be obtained from the psychosocial service summary of the first hospitalization.  PHYSICAL EXAMINATION:  Physical examination was noncontributory.  ADMITTING DIAGNOSES: Axis I:    1. Bipolar disorder, depressed.            2. Conduct disorder, adolescent onset. Axis II:   Deferred. Axis III:  Healthy. Axis IV:   Severe. Axis V:    10.  FINDINGS:   All indicated laboratory examinations were within normal limits or noncontributory.  HOSPITAL COURSE:  While in the hospital, Jessica Rios, once again, was fairly negativistic.  Jessica Rios admitted to taking the overdose but said Jessica Rios was not going to do that again and consequently Jessica Rios was ready to leave.   Jessica Rios insisted there was no reason to be in the hospital.  At other times Jessica Rios would say Jessica Rios was not telling the truth and there were other reasons why Jessica Rios was upset and then Jessica Rios would change the story apparently, or not tell the whole story.  For the most part though, Jessica Rios was oppositional and simply refusing to participate or do anything.  In Jessica Rios family session Jessica Rios continued to be oppositional towards Jessica Rios parents, yelling and screaming and cursing at them, and insisting that Jessica Rios leave and that the problem was that Jessica Rios was in the hospital, there were no other problems, Jessica Rios would go to school, Jessica Rios would not use drugs, Jessica Rios would stay away from Jessica Rios boyfriend, Jessica Rios would do everything Jessica Rios was supposed to do as long as Jessica Rios could be discharged.  As in Jessica Rios earlier hospitalizations, it seemed that Jessica Rios just did not understand things.  Jessica Rios could be contradictory and not realize it.  Jessica Rios seemed to not grasp that when you do certain things, other things happen as a consequence and that things in Jessica Rios life are very logical and followed a very logical course based on Jessica Rios behavior.  Jessica Rios, at times, did not seem to be wanting to be oppositional and defiant.  Jessica Rios just did not have any other way of processing  information to even try to manipulate Jessica Rios very well.  Jessica Rios would just simply refuse.  However, Jessica Rios did restart Jessica Rios Lithobid.  Jessica Rios had been on Lithobid in the past and reportedly had acne from it, but Jessica Rios believed that it was the most helpful of the medications Jessica Rios had taken before.  Jessica Rios mother reportedly believed Neurontin was best, but it was decided that Lithobid would be retried.  Perhaps it was the Lithobid, perhaps it was the fact that Jessica Rios just decided Jessica Rios was going to cooperate and get out of the hospital, but for whatever reason Jessica Rios did begin to behave better and was subsequently discharged.  Jessica Rios parents will continue to look for a longer term placement for Jessica Rios, because things at home just did not work  out, but for the moment Jessica Rios is returning home again.  At the time of discharge Jessica Rios was denying, as Jessica Rios had throughout the hospitalization, any suicidal thoughts.  POST HOSPITAL CARE PLANS:  Jessica Rios is referred back to Dr. Chestine Spore at the [facility] with an appointment for the 18th of July.  Jessica Rios sees Aliene Altes at the same center on the 20th of June and Terrilyn Saver, also there on the 26th of June.  DISCHARGE MEDICATIONS:  At the time of discharge he was taking: 1. Lithobid 300 mg three times a day.  Jessica Rios Lithobid level was 0.41. 2. Jessica Rios was also taking Remeron 15 mg at bedtime. 3. Claritin 10 mg in the morning.  There were no restrictions placed on Jessica Rios activity or Jessica Rios diet.  FINAL DIAGNOSES: Axis I:    1. Bipolar disorder, depressed.            2. Conduct disorder, adolescent onset. Axis II:   No diagnosis. Axis III:  Moderate obesity. Axis IV:   Severe. Axis V:    50. DD:  03/26/00 TD:  03/26/00 Job: [id number]

## 2011-02-17 ENCOUNTER — Emergency Department: Payer: Self-pay | Admitting: Emergency Medicine

## 2011-02-25 ENCOUNTER — Emergency Department: Payer: Self-pay | Admitting: Emergency Medicine

## 2011-07-19 ENCOUNTER — Encounter: Payer: Self-pay | Admitting: *Deleted

## 2011-07-19 ENCOUNTER — Emergency Department (HOSPITAL_BASED_OUTPATIENT_CLINIC_OR_DEPARTMENT_OTHER)
Admission: EM | Admit: 2011-07-19 | Discharge: 2011-07-19 | Disposition: A | Discharge status: Hospice | Payer: Medicare Other | Attending: Emergency Medicine | Admitting: Emergency Medicine

## 2011-07-19 DIAGNOSIS — F319 Bipolar disorder, unspecified: Secondary | ICD-10-CM | POA: Insufficient documentation

## 2011-07-19 DIAGNOSIS — E119 Type 2 diabetes mellitus without complications: Secondary | ICD-10-CM | POA: Insufficient documentation

## 2011-07-19 DIAGNOSIS — G4730 Sleep apnea, unspecified: Secondary | ICD-10-CM | POA: Insufficient documentation

## 2011-07-19 DIAGNOSIS — R21 Rash and other nonspecific skin eruption: Secondary | ICD-10-CM

## 2011-07-19 DIAGNOSIS — E7800 Pure hypercholesterolemia, unspecified: Secondary | ICD-10-CM | POA: Insufficient documentation

## 2011-07-19 DIAGNOSIS — I10 Essential (primary) hypertension: Secondary | ICD-10-CM | POA: Insufficient documentation

## 2011-07-19 HISTORY — DX: Bipolar disorder, unspecified: F31.9

## 2011-07-19 HISTORY — DX: Sleep apnea, unspecified: G47.30

## 2011-07-19 HISTORY — DX: Pure hypercholesterolemia, unspecified: E78.00

## 2011-07-19 HISTORY — DX: Essential (primary) hypertension: I10

## 2011-07-19 MED ORDER — DIPHENHYDRAMINE HCL 50 MG/ML IJ SOLN
50.0000 mg | Freq: Once | INTRAMUSCULAR | Status: DC
  Filled 2011-07-19: qty 1

## 2011-07-19 MED ORDER — DIPHENHYDRAMINE HCL 50 MG/ML IJ SOLN
50.0000 mg | Freq: Once | INTRAMUSCULAR | Status: AC
  Administered 2011-07-19: 50 mg via INTRAMUSCULAR

## 2011-07-19 NOTE — ED Notes (Signed)
Pt began having a full body rash last Friday. Was in a [place] [facility] unit during that time. Pt recently started Haldol 1.5weeks ago, and family thinks that may be the cause. "the doctor thought it was the antibiotic i was on for my urinary tract infection, so they took me off that a week ago, but the rash is still getting worse". Denies any sensation of throat constriction.

## 2011-07-19 NOTE — ED Provider Notes (Signed)
Medical screening examination/treatment/procedure(s) were performed by non-physician practitioner and as supervising physician I was immediately available for consultation/collaboration.   Celene Kras, MD 07/19/11 2118

## 2011-07-19 NOTE — ED Provider Notes (Signed)
History     CSN: [id number] Arrival date & time: 07/19/2011  7:56 PM   First MD Initiated Contact with Patient 07/19/11 2008      Chief Complaint  Patient presents with  . Rash    (Consider location/radiation/quality/duration/timing/severity/associated sxs/prior treatment) Patient is a 25 y.o. female presenting with rash. The history is provided by the patient. No language interpreter was used.  Rash  The current episode started more than 1 week ago. The problem has been gradually worsening. The problem is associated with nothing. There has been no fever. The fever has been present for less than 1 day. The rash is present on the abdomen, left arm, right arm, right upper leg and right lower leg. The pain has been constant since onset. Associated symptoms include itching. She has tried nothing for the symptoms. The treatment provided no relief.  Pt was treated with an antibiotic for a uti over a week ago.  Pt also reports she was recently started on haldol.  Pt reports she had a rash before and stopped antibiotic and rash went away.  Pt reports rash returned after starting haldol.  Pt was placed on haldol for hallucinations.  Past Medical History  Diagnosis Date  . Bipolar affective   . Diabetes mellitus   . Hypertension   . High cholesterol   . Sleep apnea     Past Surgical History  Procedure Date  . Tonsillectomy     No family history on file.  History  Substance Use Topics  . Smoking status: Current Everyday Smoker  . Smokeless tobacco: Not on file  . Alcohol Use: No    OB History    Grav Para Term Preterm Abortions TAB SAB Ect Mult Living                  Review of Systems  Skin: Positive for itching and rash.  Psychiatric/Behavioral: Positive for hallucinations.  All other systems reviewed and are negative.    Allergies  Effexor; Geodon; and Tegretol  Home Medications   Current Outpatient Rx  Name Route Sig Dispense Refill  . BENZTROPINE MESYLATE 1 MG  PO TABS Oral Take 1 mg by mouth 2 (two) times daily.      Marland Kitchen CLONIDINE HCL 0.1 MG PO TABS Oral Take 0.1 mg by mouth 2 (two) times daily.      Marland Kitchen DIPHENHYDRAMINE HCL 25 MG PO CAPS Oral Take 25 mg by mouth every 6 (six) hours as needed. For itching     . HALOPERIDOL 10 MG PO TABS Oral Take 10 mg by mouth 3 (three) times daily.      Marland Kitchen LORAZEPAM 2 MG PO TABS Oral Take 2 mg by mouth at bedtime.      . TRAZODONE HCL 150 MG PO TABS Oral Take 150 mg by mouth at bedtime.        BP 118/56  Pulse 98  Temp(Src) 98.9 F (37.2 C) (Oral)  Ht 5\' 9"  (1.753 m)  Wt 335 lb (151.955 kg)  BMI 49.47 kg/m2  SpO2 98%  Physical Exam  Nursing note and vitals reviewed. Constitutional: She is oriented to person, place, and time. She appears well-developed and well-nourished.  HENT:  Head: Normocephalic and atraumatic.  Right Ear: External ear normal.  Left Ear: External ear normal.  Nose: Nose normal.  Mouth/Throat: Oropharynx is clear and moist.  Eyes: Conjunctivae are normal. Pupils are equal, round, and reactive to light.  Neck: Normal range of motion. Neck supple.  Cardiovascular:  Normal rate, regular rhythm and normal heart sounds.   Pulmonary/Chest: Effort normal and breath sounds normal.  Abdominal: Soft. Bowel sounds are normal.  Musculoskeletal: Normal range of motion.  Neurological: She is alert and oriented to person, place, and time. She has normal reflexes.  Skin: Rash noted. There is erythema.  Psychiatric: She has a normal mood and affect.    ED Course  Procedures (including critical care time)  Labs Reviewed - No data to display No results found.   No diagnosis found.    MDM    Pt advised to see her MD tomorrow.  Rash looks like a drug rash.  I advised her to see her MD in am.  Pt is advised not to take any antibiotics.  She is to see her MD tomorrow before continuing haldol.       Langston Masker, [place] 07/19/11 2118

## 2014-11-20 ENCOUNTER — Emergency Department (HOSPITAL_BASED_OUTPATIENT_CLINIC_OR_DEPARTMENT_OTHER): Payer: Medicare Other

## 2014-11-20 ENCOUNTER — Emergency Department (HOSPITAL_BASED_OUTPATIENT_CLINIC_OR_DEPARTMENT_OTHER)
Admission: EM | Admit: 2014-11-20 | Discharge: 2014-11-20 | Disposition: A | Discharge status: Home / Self Care | Payer: Medicare Other | Attending: Emergency Medicine | Admitting: Emergency Medicine

## 2014-11-20 ENCOUNTER — Encounter (HOSPITAL_BASED_OUTPATIENT_CLINIC_OR_DEPARTMENT_OTHER): Payer: Self-pay | Admitting: *Deleted

## 2014-11-20 DIAGNOSIS — Z3202 Encounter for pregnancy test, result negative: Secondary | ICD-10-CM | POA: Diagnosis not present

## 2014-11-20 DIAGNOSIS — H05011 Cellulitis of right orbit: Secondary | ICD-10-CM | POA: Diagnosis not present

## 2014-11-20 DIAGNOSIS — I10 Essential (primary) hypertension: Secondary | ICD-10-CM | POA: Insufficient documentation

## 2014-11-20 DIAGNOSIS — E669 Obesity, unspecified: Secondary | ICD-10-CM | POA: Diagnosis not present

## 2014-11-20 DIAGNOSIS — Z720 Tobacco use: Secondary | ICD-10-CM | POA: Insufficient documentation

## 2014-11-20 DIAGNOSIS — F319 Bipolar disorder, unspecified: Secondary | ICD-10-CM | POA: Diagnosis not present

## 2014-11-20 DIAGNOSIS — H5712 Ocular pain, left eye: Secondary | ICD-10-CM | POA: Diagnosis present

## 2014-11-20 DIAGNOSIS — Z79899 Other long term (current) drug therapy: Secondary | ICD-10-CM | POA: Diagnosis not present

## 2014-11-20 DIAGNOSIS — R000 Tachycardia, unspecified: Secondary | ICD-10-CM | POA: Diagnosis not present

## 2014-11-20 DIAGNOSIS — F419 Anxiety disorder, unspecified: Secondary | ICD-10-CM | POA: Insufficient documentation

## 2014-11-20 DIAGNOSIS — H05012 Cellulitis of left orbit: Secondary | ICD-10-CM

## 2014-11-20 DIAGNOSIS — E119 Type 2 diabetes mellitus without complications: Secondary | ICD-10-CM | POA: Insufficient documentation

## 2014-11-20 DIAGNOSIS — L03213 Periorbital cellulitis: Secondary | ICD-10-CM

## 2014-11-20 LAB — BASIC METABOLIC PANEL
Anion gap: 4 — ABNORMAL LOW (ref 5–15)
BUN: 9 mg/dL (ref 6–23)
CHLORIDE: 104 mmol/L (ref 96–112)
CO2: 24 mmol/L (ref 19–32)
Calcium: 9 mg/dL (ref 8.4–10.5)
Creatinine, Ser: 0.65 mg/dL (ref 0.50–1.10)
GFR calc Af Amer: >90 mL/min (ref >90)
GFR calc non Af Amer: >90 mL/min (ref >90)
Glucose, Bld: 237 mg/dL — ABNORMAL HIGH (ref 70–99)
POTASSIUM: 4.1 mmol/L (ref 3.5–5.1)
SODIUM: 132 mmol/L — AB (ref 135–145)

## 2014-11-20 LAB — URINE MICROSCOPIC-ADD ON

## 2014-11-20 LAB — URINALYSIS, ROUTINE W REFLEX MICROSCOPIC
Bilirubin Urine: NEGATIVE
KETONES UR: NEGATIVE mg/dL
NITRITE: NEGATIVE
PROTEIN: NEGATIVE mg/dL
Specific Gravity, Urine: 1.027 (ref 1.005–1.030)
Urobilinogen, UA: 0.2 mg/dL (ref 0.0–1.0)
pH: 5.5 (ref 5.0–8.0)

## 2014-11-20 LAB — PREGNANCY, URINE: Preg Test, Ur: NEGATIVE

## 2014-11-20 MED ORDER — KETOROLAC TROMETHAMINE 30 MG/ML IJ SOLN
30.0000 mg | Freq: Once | INTRAMUSCULAR | Status: AC
  Administered 2014-11-20: 30 mg via INTRAVENOUS
  Filled 2014-11-20: qty 1

## 2014-11-20 MED ORDER — IOHEXOL 300 MG/ML  SOLN
80.0000 mL | Freq: Once | INTRAMUSCULAR | Status: AC | PRN
  Administered 2014-11-20: 80 mL via INTRAVENOUS

## 2014-11-20 MED ORDER — AMOXICILLIN-POT CLAVULANATE 875-125 MG PO TABS
1.0000 | ORAL_TABLET | Freq: Once | ORAL | Status: AC
  Administered 2014-11-20: 1 via ORAL
  Filled 2014-11-20: qty 1

## 2014-11-20 MED ORDER — HYDROCODONE-ACETAMINOPHEN 5-325 MG PO TABS: 1.0000 | ORAL_TABLET | Freq: Four times a day (QID) | ORAL | Status: DC | PRN

## 2014-11-20 MED ORDER — AMOXICILLIN-POT CLAVULANATE 875-125 MG PO TABS: 1.0000 | ORAL_TABLET | Freq: Two times a day (BID) | ORAL | Status: DC

## 2014-11-20 MED ORDER — SODIUM CHLORIDE 0.9 % IV BOLUS (SEPSIS)
1000.0000 mL | Freq: Once | INTRAVENOUS | Status: AC
  Administered 2014-11-20: 1000 mL via INTRAVENOUS

## 2014-11-20 MED ORDER — FENTANYL CITRATE 0.05 MG/ML IJ SOLN: 50.0000 ug | Freq: Once | INTRAMUSCULAR | Status: DC

## 2014-11-20 NOTE — ED Notes (Signed)
Pt alert x4 respirations easy non labored. Pt ambulates with steady gait.

## 2014-11-20 NOTE — Discharge Instructions (Signed)
As discussed, it is important to keep your appointment in 2 days with your eye doctor.  Please take all medication as directed.  Return here for concerning changes in your condition.

## 2014-11-20 NOTE — ED Provider Notes (Signed)
CSN: [id number]     Arrival date & time 11/20/14  0903 History   First MD Initiated Contact with Patient 11/20/14 [phone]     Chief Complaint  Patient presents with  . Eye Pain   HPI  Patient presents with concern of increasing left eye discomfort. Symptoms began 4 days ago, initially with erythema about the eye. There was no clear precipitant. That day the patient spoke with her primary care physician, was referred to optometry. Patient was started on erythromycin topical the following day. Subsequent, the patient has had increasing swelling in the upper and lower lids, as well as redness in the eye. Patient was referred by her optometrist to an ophthalmologist, who she saw yesterday. Patient was started on oral medication as well. Today the patient awoke with increasing swelling in spite of taking all medication as directed. No change in visual accuracy, nor any new headache, fever, vomiting, other complaints. No relief with Tylenol. Patient call her ophthalmologist today, was sent to the emergency room for further evaluation.   Past Medical History  Diagnosis Date  . Bipolar affective   . Diabetes mellitus   . Hypertension   . High cholesterol   . Sleep apnea    Past Surgical History  Procedure Laterality Date  . Tonsillectomy     No family history on file. History  Substance Use Topics  . Smoking status: Current Every Day Smoker -- 1.00 packs/day    Types: Cigarettes  . Smokeless tobacco: Not on file  . Alcohol Use: No   OB History    No data available     Review of Systems  Constitutional:       Per HPI, otherwise negative  HENT:       Per HPI, otherwise negative  Eyes: Positive for pain, discharge and redness. Negative for visual disturbance.  Respiratory:       Per HPI, otherwise negative  Cardiovascular:       Per HPI, otherwise negative  Gastrointestinal: Negative for nausea and vomiting.  Endocrine:          Musculoskeletal:       Per HPI, otherwise  negative  Skin: Positive for color change.  Allergic/Immunologic: Negative for immunocompromised state.  Neurological: Negative for syncope.  Psychiatric/Behavioral: The patient is nervous/anxious.       Allergies  Effexor; Geodon; and Tegretol  Home Medications   Prior to Admission medications   Medication Sig Start Date End Date Taking? Authorizing Provider  haloperidol (HALDOL) 10 MG tablet Take 10 mg by mouth 3 (three) times daily.     Yes Historical Provider, MD  lithium carbonate (ESKALITH) 450 MG CR tablet Take by mouth 3 (three) times daily.   Yes Historical Provider, MD  metFORMIN (GLUCOPHAGE) 500 MG tablet Take by mouth 2 (two) times daily with a meal.   Yes Historical Provider, MD  traZODone (DESYREL) 150 MG tablet Take 150 mg by mouth at bedtime.     Yes Historical Provider, MD  benztropine (COGENTIN) 1 MG tablet Take 1 mg by mouth 2 (two) times daily.      Historical Provider, MD  cloNIDine (CATAPRES) 0.1 MG tablet Take 0.1 mg by mouth 2 (two) times daily.      Historical Provider, MD  diphenhydrAMINE (BENADRYL) 25 mg capsule Take 25 mg by mouth every 6 (six) hours as needed. For itching     Historical Provider, MD  LORazepam (ATIVAN) 2 MG tablet Take 2 mg by mouth at bedtime.  Historical Provider, MD   BP 119/68 mmHg  Pulse 93  Temp(Src) 98.4 F (36.9 C) (Oral)  Resp 22  Ht 5\' 9"  (1.753 m)  Wt 480 lb 1.6 oz (217.772 kg)  BMI 70.87 kg/m2  SpO2 94% Physical Exam  Constitutional: She is oriented to person, place, and time. She appears well-developed and well-nourished. No distress.  Obese young female sitting upright, speaking clearly, awake and alert, interacting appropriately.  HENT:  Head: Normocephalic and atraumatic.  Eyes: EOM are normal. Left eye exhibits chemosis and discharge. Left eye exhibits no exudate. Left conjunctiva is injected. No scleral icterus. Left eye exhibits normal extraocular motion and no nystagmus. Left pupil is reactive.  There is  notable swelling about the left eye lids upper and lower, with erythema. It takes effort to open the lids to allow for direct evaluation of the eye proper.   Cardiovascular: Regular rhythm.  Tachycardia present.   Pulmonary/Chest: Effort normal and breath sounds normal. No stridor. No respiratory distress.  Abdominal: She exhibits no distension.  Musculoskeletal: She exhibits no edema.  Neurological: She is alert and oriented to person, place, and time. No cranial nerve deficit.  Skin: Skin is warm and dry.  Psychiatric: She has a normal mood and affect.  Nursing note and vitals reviewed.   ED Course  Procedures (including critical care time) Labs Review Labs Reviewed  BASIC METABOLIC PANEL - Abnormal; Notable for the following:    Sodium 132 (*)    Glucose, Bld 237 (*)    Anion gap 4 (*)    All other components within normal limits  URINALYSIS, ROUTINE W REFLEX MICROSCOPIC - Abnormal; Notable for the following:    Glucose, UA >1000 (*)    Hgb urine dipstick TRACE (*)    Leukocytes, UA SMALL (*)    All other components within normal limits  URINE MICROSCOPIC-ADD ON - Abnormal; Notable for the following:    Squamous Epithelial / LPF MANY (*)    Bacteria, UA MANY (*)    All other components within normal limits  PREGNANCY, URINE    Imaging Review Ct Orbits W/cm  11/20/2014   CLINICAL DATA:  Left orbital pain, swelling and erythema.  EXAM: CT ORBITS WITH CONTRAST  TECHNIQUE: Multidetector CT imaging of the orbits was performed following the bolus administration of intravenous contrast.  CONTRAST:  80mL OMNIPAQUE IOHEXOL 300 MG/ML  SOLN  COMPARISON:  None.  FINDINGS: There is evidence of left orbital cellulitis which appears preseptal in location by CT. No intraorbital inflammation is seen. No focal abscess is identified. The globes are intact. Extraocular musculature appears normal bilaterally.  No associated sinusitis is identified. The mastoid air cells are normally aerated. No  soft tissue masses, foreign body or enlarged lymph nodes seen. The limited amount of the airway identified shows no abnormalities. Small nodules in the left parotid gland are identified with the largest measuring approximately 12 mm. These are likely intraparotid lymph nodes.  IMPRESSION: Preseptal left orbital cellulitis. No focal abscess or intraorbital involvement.   Electronically Signed   By: Irish LackGlenn  Yamagata M.D.   On: 11/20/2014 12:25    On repeat exam the patient is lying calmly in bed.  No new complaint sprayed I reviewed the CT imaging with her. Patient has follow-up scheduled with her ophthalmologist in 2 days. Patient now does not recall taking antibiotics orally.  She was started on antibiotics now, we'll continue taking her topical medication as well.   MDM   Final diagnoses:  Cellulitis  of left orbit    Patient presents with left eye swelling, pain. Patient's visual acuity is appropriate, she is afebrile, and there is low suspicion for orbital cellulitis, rather than preseptal cellulitis, which is demonstrated on CT scan. Patient had initiation of oral antibiotics to complement the topical medication she was started on by her eye care team. With no new changes, and with appropriate follow-up rescheduled, she was appropriate for further evaluation, management to occur as an outpatient. I explicitly discussed return precautions with her.  Gerhard Munch, MD 11/20/14 1316

## 2014-11-20 NOTE — ED Notes (Signed)
C/o left eye irritation with burning onset 5 days. Redness and swelling to left eye. No injury.

## 2014-11-20 NOTE — ED Notes (Signed)
Patient transported to CT 

## 2015-08-02 ENCOUNTER — Encounter (HOSPITAL_COMMUNITY): Payer: Self-pay | Admitting: *Deleted

## 2015-08-02 ENCOUNTER — Emergency Department (HOSPITAL_COMMUNITY)
Admission: EM | Admit: 2015-08-02 | Discharge: 2015-08-02 | Discharge status: Against Medical Advice | Payer: Medicare Other | Attending: Emergency Medicine | Admitting: Emergency Medicine

## 2015-08-02 DIAGNOSIS — E119 Type 2 diabetes mellitus without complications: Secondary | ICD-10-CM | POA: Diagnosis not present

## 2015-08-02 DIAGNOSIS — R45851 Suicidal ideations: Secondary | ICD-10-CM | POA: Insufficient documentation

## 2015-08-02 DIAGNOSIS — Z720 Tobacco use: Secondary | ICD-10-CM | POA: Diagnosis not present

## 2015-08-02 LAB — COMPREHENSIVE METABOLIC PANEL
ALT: 28 U/L (ref 14–54)
ANION GAP: 8 (ref 5–15)
AST: 21 U/L (ref 15–41)
Albumin: 3.8 g/dL (ref 3.5–5.0)
Alkaline Phosphatase: 58 U/L (ref 38–126)
BILIRUBIN TOTAL: 0.4 mg/dL (ref 0.3–1.2)
BUN: 6 mg/dL (ref 6–20)
CO2: 26 mmol/L (ref 22–32)
Calcium: 9.5 mg/dL (ref 8.9–10.3)
Chloride: 106 mmol/L (ref 101–111)
Creatinine, Ser: 0.72 mg/dL (ref 0.44–1.00)
Glucose, Bld: 114 mg/dL — ABNORMAL HIGH (ref 65–99)
POTASSIUM: 4.3 mmol/L (ref 3.5–5.1)
Sodium: 140 mmol/L (ref 135–145)
TOTAL PROTEIN: 7 g/dL (ref 6.5–8.1)

## 2015-08-02 LAB — I-STAT BETA HCG BLOOD, ED (MC, WL, AP ONLY): I-stat hCG, quantitative: <5 m[IU]/mL (ref <5)

## 2015-08-02 LAB — CBC
HEMATOCRIT: 45.6 % (ref 36.0–46.0)
Hemoglobin: 14.7 g/dL (ref 12.0–15.0)
MCH: 30.8 pg (ref 26.0–34.0)
MCHC: 32.2 g/dL (ref 30.0–36.0)
MCV: 95.6 fL (ref 78.0–100.0)
Platelets: 270 10*3/uL (ref 150–400)
RBC: 4.77 MIL/uL (ref 3.87–5.11)
RDW: 14.5 % (ref 11.5–15.5)
WBC: 13.8 10*3/uL — AB (ref 4.0–10.5)

## 2015-08-02 LAB — ETHANOL: Alcohol, Ethyl (B): <5 mg/dL (ref <5)

## 2015-08-02 LAB — SALICYLATE LEVEL: Salicylate Lvl: <4 mg/dL (ref 2.8–30.0)

## 2015-08-02 LAB — ACETAMINOPHEN LEVEL: Acetaminophen (Tylenol), Serum: <10 ug/mL — ABNORMAL LOW (ref 10–30)

## 2015-08-02 NOTE — ED Notes (Signed)
Pt started becoming suicidal today while in therapy. Pt does not have a plan and denies doing anything to self.  Remains SI.

## 2015-08-02 NOTE — ED Notes (Signed)
Pt spotted leaving in clothes.  She was placed in 2 gowns due to size.  NF trying to call patient to come back.

## 2015-08-02 NOTE — ED Notes (Signed)
Tried to call patient's home and mobile number per [place], AD. Patient had been seen leaving the ED.   Did not locate patient.   No answer to either phone.

## 2019-12-29 ENCOUNTER — Emergency Department (HOSPITAL_COMMUNITY)
Admission: EM | Admit: 2019-12-29 | Discharge: 2019-12-29 | Disposition: A | Discharge status: Home / Self Care | Payer: Medicare (Managed Care) | Attending: Emergency Medicine | Admitting: Emergency Medicine

## 2019-12-29 ENCOUNTER — Other Ambulatory Visit: Payer: Self-pay

## 2019-12-29 ENCOUNTER — Encounter (HOSPITAL_COMMUNITY): Payer: Self-pay | Admitting: Emergency Medicine

## 2019-12-29 DIAGNOSIS — Z5321 Procedure and treatment not carried out due to patient leaving prior to being seen by health care provider: Secondary | ICD-10-CM | POA: Diagnosis not present

## 2019-12-29 DIAGNOSIS — F419 Anxiety disorder, unspecified: Secondary | ICD-10-CM | POA: Insufficient documentation

## 2019-12-29 NOTE — ED Triage Notes (Signed)
Pt in with high anxiety, states she gets this way often when her husband leaves for work. Began this am, denies any SI/HI, just states she feels overwhelmed and her feet hurt. Lives at home with husband, but states her mom brought her today since she is tired of her complaining. Tangential thoughts during triage. Denies any ETOH or drug use in past 24h.

## 2019-12-29 NOTE — ED Notes (Signed)
During triage, pt stated she was just d/c'd from [facility] and she does not want to be here. LWBS, she denies any SI/HI/AH/VH

## 2020-01-16 ENCOUNTER — Encounter (HOSPITAL_COMMUNITY): Payer: Self-pay | Admitting: Emergency Medicine

## 2020-01-16 ENCOUNTER — Other Ambulatory Visit: Payer: Self-pay

## 2020-01-16 ENCOUNTER — Emergency Department (HOSPITAL_COMMUNITY)
Admission: EM | Admit: 2020-01-16 | Discharge: 2020-01-16 | Disposition: A | Discharge status: Home / Self Care | Payer: Medicare (Managed Care) | Attending: Emergency Medicine | Admitting: Emergency Medicine

## 2020-01-16 DIAGNOSIS — F17210 Nicotine dependence, cigarettes, uncomplicated: Secondary | ICD-10-CM | POA: Insufficient documentation

## 2020-01-16 DIAGNOSIS — I10 Essential (primary) hypertension: Secondary | ICD-10-CM | POA: Insufficient documentation

## 2020-01-16 DIAGNOSIS — Z7984 Long term (current) use of oral hypoglycemic drugs: Secondary | ICD-10-CM | POA: Insufficient documentation

## 2020-01-16 DIAGNOSIS — R45851 Suicidal ideations: Secondary | ICD-10-CM | POA: Diagnosis not present

## 2020-01-16 DIAGNOSIS — E119 Type 2 diabetes mellitus without complications: Secondary | ICD-10-CM | POA: Diagnosis not present

## 2020-01-16 DIAGNOSIS — F250 Schizoaffective disorder, bipolar type: Secondary | ICD-10-CM | POA: Diagnosis not present

## 2020-01-16 DIAGNOSIS — Z79899 Other long term (current) drug therapy: Secondary | ICD-10-CM | POA: Diagnosis not present

## 2020-01-16 DIAGNOSIS — F329 Major depressive disorder, single episode, unspecified: Secondary | ICD-10-CM | POA: Diagnosis present

## 2020-01-16 LAB — CBC
HCT: 47.7 % — ABNORMAL HIGH (ref 36.0–46.0)
Hemoglobin: 14.8 g/dL (ref 12.0–15.0)
MCH: 30.5 pg (ref 26.0–34.0)
MCHC: 31 g/dL (ref 30.0–36.0)
MCV: 98.1 fL (ref 80.0–100.0)
Platelets: 327 10*3/uL (ref 150–400)
RBC: 4.86 MIL/uL (ref 3.87–5.11)
RDW: 14.8 % (ref 11.5–15.5)
WBC: 16.9 10*3/uL — ABNORMAL HIGH (ref 4.0–10.5)
nRBC: 0 % (ref 0.0–0.2)

## 2020-01-16 LAB — RAPID URINE DRUG SCREEN, HOSP PERFORMED
Amphetamines: POSITIVE — AB
Barbiturates: NOT DETECTED
Benzodiazepines: POSITIVE — AB
Cocaine: NOT DETECTED
Opiates: NOT DETECTED
Tetrahydrocannabinol: NOT DETECTED

## 2020-01-16 LAB — COMPREHENSIVE METABOLIC PANEL
ALT: 26 U/L (ref 0–44)
AST: 18 U/L (ref 15–41)
Albumin: 3.9 g/dL (ref 3.5–5.0)
Alkaline Phosphatase: 54 U/L (ref 38–126)
Anion gap: 11 (ref 5–15)
BUN: 29 mg/dL — ABNORMAL HIGH (ref 6–20)
CO2: 17 mmol/L — ABNORMAL LOW (ref 22–32)
Calcium: 10 mg/dL (ref 8.9–10.3)
Chloride: 110 mmol/L (ref 98–111)
Creatinine, Ser: 1.28 mg/dL — ABNORMAL HIGH (ref 0.44–1.00)
GFR calc Af Amer: >60 mL/min (ref >60)
GFR calc non Af Amer: 54 mL/min — ABNORMAL LOW (ref >60)
Glucose, Bld: 184 mg/dL — ABNORMAL HIGH (ref 70–99)
Potassium: 4.4 mmol/L (ref 3.5–5.1)
Sodium: 138 mmol/L (ref 135–145)
Total Bilirubin: 0.8 mg/dL (ref 0.3–1.2)
Total Protein: 6.6 g/dL (ref 6.5–8.1)

## 2020-01-16 LAB — URINALYSIS, ROUTINE W REFLEX MICROSCOPIC
Bilirubin Urine: NEGATIVE
Glucose, UA: 50 mg/dL — AB
Hgb urine dipstick: NEGATIVE
Ketones, ur: NEGATIVE mg/dL
Leukocytes,Ua: NEGATIVE
Nitrite: NEGATIVE
Protein, ur: NEGATIVE mg/dL
Specific Gravity, Urine: 1.021 (ref 1.005–1.030)
pH: 5 (ref 5.0–8.0)

## 2020-01-16 LAB — ACETAMINOPHEN LEVEL: Acetaminophen (Tylenol), Serum: <10 ug/mL — ABNORMAL LOW (ref 10–30)

## 2020-01-16 LAB — LITHIUM LEVEL: Lithium Lvl: 0.65 mmol/L (ref 0.60–1.20)

## 2020-01-16 LAB — LACTIC ACID, PLASMA: Lactic Acid, Venous: 1.1 mmol/L (ref 0.5–1.9)

## 2020-01-16 LAB — I-STAT BETA HCG BLOOD, ED (MC, WL, AP ONLY): I-stat hCG, quantitative: <5 m[IU]/mL (ref <5)

## 2020-01-16 LAB — ETHANOL: Alcohol, Ethyl (B): <10 mg/dL (ref <10)

## 2020-01-16 LAB — SALICYLATE LEVEL: Salicylate Lvl: <7 mg/dL — ABNORMAL LOW (ref 7.0–30.0)

## 2020-01-16 MED ORDER — LACTATED RINGERS IV BOLUS: 1000.0000 mL | Freq: Once | INTRAVENOUS | Status: DC

## 2020-01-16 NOTE — ED Notes (Signed)
Per [facility], pt is recommended for d/c.
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
## 2020-01-16 NOTE — ED Notes (Signed)
Patient refused discharge instructions.

## 2020-01-16 NOTE — ED Notes (Signed)
[facility] recommended d/c. Pt stormed out of room refusing to stay and wait for discharge paperwork. Attempted to get patient to wait for d/c paperwork but pt would not stay. MD notified.

## 2020-01-16 NOTE — ED Provider Notes (Signed)
[facility] EMERGENCY DEPARTMENT Provider Note   CSN: [id number] Arrival date & time: 01/16/20  0901     History Chief Complaint  Patient presents with  . Suicidal  . Depression    Jessica Rios is a 34 y.o. female.  HPI 34 year old female presents with suicidal thoughts and anxiety.  Ongoing since about March.  She has been paranoid and feeling like people are watching her in her apartment.  She states that she feels homicidal towards anyone that would enter her apartment though no one specifically.  She is also suicidal though at this time has no plan.  States she has been admitted to behavioral health before.  She denies any medical complaints and states she has received both Covid vaccines.   Her feet are noted to be soiled.  She states that this is a mixture of dirt and stool.  States she has no one at home to help clean her up.  When asked who she lives with, she states her husband but he won't help.  Past Medical History:  Diagnosis Date  . Bipolar affective (HCC)   . Diabetes mellitus   . High cholesterol   . Hypertension   . Sleep apnea     There are no problems to display for this patient.   Past Surgical History:  Procedure Laterality Date  . TONSILLECTOMY       OB History   No obstetric history on file.     No family history on file.  Social History   Tobacco Use  . Smoking status: Current Every Day Smoker    Packs/day: 1.00    Types: Cigarettes  . Smokeless tobacco: Never Used  Substance Use Topics  . Alcohol use: No  . Drug use: No    Home Medications Prior to Admission medications   Medication Sig Start Date End Date Taking? Authorizing Provider  amoxicillin-clavulanate (AUGMENTIN) 875-125 MG per tablet Take 1 tablet by mouth 2 (two) times daily. 11/20/14   Gerhard Munch, MD  benztropine (COGENTIN) 1 MG tablet Take 1 mg by mouth 2 (two) times daily.      [provider]  cloNIDine (CATAPRES) 0.1 MG tablet Take  0.1 mg by mouth 2 (two) times daily.      [provider]  diphenhydrAMINE (BENADRYL) 25 mg capsule Take 25 mg by mouth every 6 (six) hours as needed. For itching     [provider]  haloperidol (HALDOL) 10 MG tablet Take 10 mg by mouth 3 (three) times daily.      [provider]  HYDROcodone-acetaminophen (NORCO/VICODIN) 5-325 MG per tablet Take 1 tablet by mouth every 6 (six) hours as needed for severe pain. 11/20/14   Gerhard Munch, MD  lithium carbonate (ESKALITH) 450 MG CR tablet Take by mouth 3 (three) times daily.    [provider]  LORazepam (ATIVAN) 2 MG tablet Take 2 mg by mouth at bedtime.      [provider]  metFORMIN (GLUCOPHAGE) 500 MG tablet Take by mouth 2 (two) times daily with a meal.    [provider]  traZODone (DESYREL) 150 MG tablet Take 150 mg by mouth at bedtime.      [provider]    Allergies    Effexor [venlafaxine hydrochloride], Geodon [ziprasidone hydrochloride], and Tegretol [carbamazepine]  Review of Systems   Review of Systems  Respiratory: Negative for cough and shortness of breath.   Psychiatric/Behavioral: Positive for dysphoric mood and suicidal ideas. The patient  is nervous/anxious.   All other systems reviewed and are negative.   Physical Exam Updated Vital Signs BP (!) 157/137 (BP Location: Left Arm)   Pulse 95   Temp 98.4 F (36.9 C) (Oral)   Resp 20   SpO2 95%   Physical Exam Vitals and nursing note reviewed.  Constitutional:      General: She is not in acute distress.    Appearance: She is well-developed. She is obese. She is not ill-appearing or diaphoretic.  HENT:     Head: Normocephalic and atraumatic.     Right Ear: External ear normal.     Left Ear: External ear normal.     Nose: Nose normal.  Eyes:     General:        Right eye: No discharge.        Left eye: No discharge.  Cardiovascular:     Rate and Rhythm: Normal rate and regular rhythm.     Heart  sounds: Normal heart sounds.  Pulmonary:     Effort: Pulmonary effort is normal.     Breath sounds: Normal breath sounds.  Abdominal:     Palpations: Abdomen is soft.     Tenderness: There is no abdominal tenderness.  Skin:    General: Skin is warm and dry.     Comments: Dirt/stool caked to bilateral feet  Neurological:     Mental Status: She is alert and oriented to person, place, and time.  Psychiatric:        Mood and Affect: Mood is not anxious.        Thought Content: Thought content includes suicidal ideation.     ED Results / Procedures / Treatments   Labs (all labs ordered are listed, but only abnormal results are displayed) Labs Reviewed  COMPREHENSIVE METABOLIC PANEL - Abnormal; Notable for the following components:      Result Value   CO2 17 (*)    Glucose, Bld 184 (*)    BUN 29 (*)    Creatinine, Ser 1.28 (*)    GFR calc non Af Amer 54 (*)    All other components within normal limits  SALICYLATE LEVEL - Abnormal; Notable for the following components:   Salicylate Lvl <7.0 (*)    All other components within normal limits  ACETAMINOPHEN LEVEL - Abnormal; Notable for the following components:   Acetaminophen (Tylenol), Serum <10 (*)    All other components within normal limits  CBC - Abnormal; Notable for the following components:   WBC 16.9 (*)    HCT 47.7 (*)    All other components within normal limits  RAPID URINE DRUG SCREEN, HOSP PERFORMED - Abnormal; Notable for the following components:   Benzodiazepines POSITIVE (*)    Amphetamines POSITIVE (*)    All other components within normal limits  URINALYSIS, ROUTINE W REFLEX MICROSCOPIC - Abnormal; Notable for the following components:   APPearance HAZY (*)    Glucose, UA 50 (*)    All other components within normal limits  ETHANOL  LITHIUM LEVEL  LACTIC ACID, PLASMA  I-STAT BETA HCG BLOOD, ED ([facility], [facility], [facility] ONLY)    EKG None  Radiology No results found.  Procedures Procedures (including critical  care time)  Medications Ordered in ED Medications  lactated ringers bolus 1,000 mL (1,000 mLs Intravenous Refused 01/16/20 1441)    ED Course  I have reviewed the triage vital signs and the nursing notes.  Pertinent labs & imaging results that were available during my  care of the patient were reviewed by me and considered in my medical decision making (see chart for details).    MDM Rules/Calculators/A&P                      Labs show increase in creatinine compared to 2016.  However in chart review from [facility], creatinine is the same as yesterday.  Bicarbonate is slightly lower though it has been 19 the last few days.  Lactate was checked and is negative.  White blood cell count is elevated but downtrending compared to recent evaluations at [facility].  She is not altered.  She does have underlying psychiatric disease but psychiatry is recommending discharge.  She definitely needs outpatient psychiatric help but it is not clear that she needs an inpatient psychiatric stabilization emergently.  Mom is not happy about this.  Offered to have psychiatry talk to mom over the phone but she declines and left the ER right after this conversation.  Patient then walked out without paperwork. Final Clinical Impression(s) / ED Diagnoses Final diagnoses:  Schizoaffective disorder, bipolar type [facility])    Rx / DC Orders ED Discharge Orders    None       Sherwood Gambler, MD 01/16/20 1551

## 2020-01-16 NOTE — ED Notes (Signed)
Got patient some water patient is resting with family at bedside

## 2020-01-16 NOTE — Consult Note (Signed)
Telepsych Consultation   Reason for Consult:  Mood irritability  Referring Physician:  EDP Location of Patient: 37[facility] Location of Provider: [facility]  Patient Identification: Jessica Rios MRN:  [id number] Principal Diagnosis: <principal problem not specified> Diagnosis:  Active Problems:   * No active hospital problems. *   Total Time spent with patient: 15 minutes  Subjective:   Jessica Rios is a 34 y.o. female was seen and evaluated via teleassessment.  Denied suicidal or homicidal ideations.  Denies auditory or visual hallucinations.  Mother Jessica Rios is at bedside during assessment. mother reports mood lability and aggressiveness at home.  Patient dose report paranoid ideations.  Mother reports she does not feel as if her (patient's) medication are correct.  Reported patient was recently discharged from [facility] from inpatient facility.  Reported patient was evaluated for 7 days with medication adjustments.  Patient was advised to follow-up with primary psychiatrist however was reported that she has not been able to connect with a psychiatrist at this time.  Patient was offered her overnight observation however she declined.  Recommend patient keep follow-up appointment with outpatient provider.  Case staffed with attending psychiatrist MD Cobos.  Recommend keep all follow-up appointments. continue medications as directed.  Support encouragement reassurance provided  HPI: Per admission assessment note: 34 year old female presents with suicidal thoughts and anxiety.  Ongoing since about March.  She has been paranoid and feeling like people are watching her in her apartment.  She states that she feels homicidal towards anyone that would enter her apartment though no one specifically.  She is also suicidal though at this time has no plan.  States she has been admitted to behavioral health before.  She denies any medical complaints and states she has received both Covid  vaccines.  Past Psychiatric History:   Risk to Self: Suicidal Ideation: Yes-Currently Present Suicidal Intent: No Is patient at risk for suicide?: Yes Suicidal Plan?: No Access to Means: No What has been your use of drugs/alcohol within the last 12 months?: (none reported) How many times?: 5 Other Self Harm Risks: health issues Triggers for Past Attempts: None known Intentional Self Injurious Behavior: None Risk to Others: Homicidal Ideation: (P) No Thoughts of Harm to Others: (P) No Current Homicidal Intent: (P) No Current Homicidal Plan: (P) No Access to Homicidal Means: (P) No Identified Victim: (P) none History of harm to others?: (P) No Assessment of Violence: (P) None Noted Violent Behavior Description: (P) none Does patient have access to weapons?: (P) No Criminal Charges Pending?: (P) No Does patient have a court date: (P) No Prior Inpatient Therapy: Prior Inpatient Therapy: (P) Yes Prior Therapy Dates: (P) 11/2019 [facility] Prior Therapy Facilty/Provider(s): (P) [facility] Reason for Treatment: (P) schizoaffective Disorder Prior Outpatient Therapy: Prior Outpatient Therapy: (P) Yes  Past Medical History:  Past Medical History:  Diagnosis Date  . Bipolar affective (HCC)   . Diabetes mellitus   . High cholesterol   . Hypertension   . Sleep apnea     Past Surgical History:  Procedure Laterality Date  . TONSILLECTOMY     Family History: No family history on file. Family Psychiatric  History:  Social History:  Social History   Substance and Sexual Activity  Alcohol Use No     Social History   Substance and Sexual Activity  Drug Use No    Social History   Socioeconomic History  . Marital status: Legally Separated    Spouse name: Not on file  . Number of  children: Not on file  . Years of education: Not on file  . Highest education level: Not on file  Occupational History  . Not on file  Tobacco Use  . Smoking status: Current Every Day Smoker    Packs/day:  1.00    Types: Cigarettes  . Smokeless tobacco: Never Used  Substance and Sexual Activity  . Alcohol use: No  . Drug use: No  . Sexual activity: Not Currently  Other Topics Concern  . Not on file  Social History Narrative  . Not on file   Social Determinants of Health   Financial Resource Strain:   . Difficulty of Paying Living Expenses:   Food Insecurity:   . Worried About Charity fundraiser in the Last Year:   . Arboriculturist in the Last Year:   Transportation Needs:   . Film/video editor (Medical):   Marland Kitchen Lack of Transportation (Non-Medical):   Physical Activity:   . Days of Exercise per Week:   . Minutes of Exercise per Session:   Stress:   . Feeling of Stress :   Social Connections:   . Frequency of Communication with Friends and Family:   . Frequency of Social Gatherings with Friends and Family:   . Attends Religious Services:   . Active Member of Clubs or Organizations:   . Attends Archivist Meetings:   Marland Kitchen Marital Status:    Additional Social History:    Allergies:   Allergies  Allergen Reactions  . Effexor [Venlafaxine Hydrochloride] Anxiety  . Geodon [Ziprasidone Hydrochloride] Rash  . Tegretol [Carbamazepine] Anxiety    Labs:  Results for orders placed or performed during the hospital encounter of 01/16/20 (from the past 48 hour(s))  Comprehensive metabolic panel     Status: Abnormal   Collection Time: 01/16/20  9:17 AM  Result Value Ref Range   Sodium 138 135 - 145 mmol/L   Potassium 4.4 3.5 - 5.1 mmol/L   Chloride 110 98 - 111 mmol/L   CO2 17 (L) 22 - 32 mmol/L   Glucose, Bld 184 (H) 70 - 99 mg/dL    Comment: Glucose reference range applies only to samples taken after fasting for at least 8 hours.   BUN 29 (H) 6 - 20 mg/dL   Creatinine, Ser 1.28 (H) 0.44 - 1.00 mg/dL   Calcium 10.0 8.9 - 10.3 mg/dL   Total Protein 6.6 6.5 - 8.1 g/dL   Albumin 3.9 3.5 - 5.0 g/dL   AST 18 15 - 41 U/L   ALT 26 0 - 44 U/L   Alkaline Phosphatase 54  38 - 126 U/L   Total Bilirubin 0.8 0.3 - 1.2 mg/dL   GFR calc non Af Amer 54 (L) >60 mL/min   GFR calc Af Amer >60 >60 mL/min   Anion gap 11 5 - 15    Comment: Performed at [facility] [address], [place], [place] [address]  Ethanol     Status: None   Collection Time: 01/16/20  9:17 AM  Result Value Ref Range   Alcohol, Ethyl (B) <10 <10 mg/dL    Comment: (NOTE) Lowest detectable limit for serum alcohol is 10 mg/dL. For medical purposes only. Performed at [facility], [place] [address], [place], [place] [address]   Salicylate level     Status: Abnormal   Collection Time: 01/16/20  9:17 AM  Result Value Ref Range   Salicylate Lvl <8.4 (L) 7.0 - 30.0 mg/dL  Comment: Performed at [facility], [address] [place], [place] [address]  Acetaminophen level     Status: Abnormal   Collection Time: 01/16/20  9:17 AM  Result Value Ref Range   Acetaminophen (Tylenol), Serum <10 (L) 10 - 30 ug/mL    Comment: (NOTE) Therapeutic concentrations vary significantly. A range of 10-30 ug/mL  may be an effective concentration for many patients. However, some  are best treated at concentrations outside of this range. Acetaminophen concentrations >150 ug/mL at 4 hours after ingestion  and >50 ug/mL at 12 hours after ingestion are often associated with  toxic reactions. Performed at [facility], [address] [place], [place] [address]   cbc     Status: Abnormal   Collection Time: 01/16/20  9:17 AM  Result Value Ref Range   WBC 16.9 (H) 4.0 - 10.5 K/uL   RBC 4.86 3.87 - 5.11 MIL/uL   Hemoglobin 14.8 12.0 - 15.0 g/dL   HCT 00.1 (H) 74.9 - 44.9 %   MCV 98.1 80.0 - 100.0 fL   MCH 30.5 26.0 - 34.0 pg   MCHC 31.0 30.0 - 36.0 g/dL   RDW 67.5 91.6 - 38.4 %   Platelets 327 150 - 400 K/uL   nRBC 0.0 0.0 - 0.2 %    Comment: Performed at [facility], [address], [place], [place] [address]  I-Stat beta hCG blood, ED     Status: None   Collection Time:  01/16/20  9:46 AM  Result Value Ref Range   I-stat hCG, quantitative <5.0 <5 mIU/mL   Comment 3            Comment:   GEST. AGE      CONC.  (mIU/mL)   <=1 WEEK        5 - 50     2 WEEKS       50 - 500     3 WEEKS       100 - 10,000     4 WEEKS     1,000 - 30,000        FEMALE AND NON-PREGNANT FEMALE:     LESS THAN 5 mIU/mL   Lithium level     Status: None   Collection Time: 01/16/20 11:12 AM  Result Value Ref Range   Lithium Lvl 0.65 0.60 - 1.20 mmol/L    Comment: Performed at [facility], [address], [place], [place] [address]  Lactic acid, plasma     Status: None   Collection Time: 01/16/20 11:12 AM  Result Value Ref Range   Lactic Acid, Venous 1.1 0.5 - 1.9 mmol/L    Comment: Performed at [facility], [address], [place], [place] [address]  Rapid urine drug screen (hospital performed)     Status: Abnormal   Collection Time: 01/16/20  1:29 PM  Result Value Ref Range   Opiates NONE DETECTED NONE DETECTED   Cocaine NONE DETECTED NONE DETECTED   Benzodiazepines POSITIVE (A) NONE DETECTED   Amphetamines POSITIVE (A) NONE DETECTED   Tetrahydrocannabinol NONE DETECTED NONE DETECTED   Barbiturates NONE DETECTED NONE DETECTED    Comment: (NOTE) DRUG SCREEN FOR MEDICAL PURPOSES ONLY.  IF CONFIRMATION IS NEEDED FOR ANY PURPOSE, NOTIFY LAB WITHIN 5 DAYS. LOWEST DETECTABLE LIMITS FOR URINE DRUG SCREEN Drug Class                     Cutoff (ng/mL) Amphetamine  and metabolites    1000 Barbiturate and metabolites    200 Benzodiazepine                 200 Tricyclics and metabolites     300 Opiates and metabolites        300 Cocaine and metabolites        300 THC                            50 Performed at [facility], [address] [place], [place] [address]   Urinalysis, Routine w reflex microscopic     Status: Abnormal   Collection Time: 01/16/20  1:29 PM  Result Value Ref Range   Color, Urine YELLOW YELLOW   APPearance HAZY (A) CLEAR   Specific  Gravity, Urine 1.021 1.005 - 1.030   pH 5.0 5.0 - 8.0   Glucose, UA 50 (A) NEGATIVE mg/dL   Hgb urine dipstick NEGATIVE NEGATIVE   Bilirubin Urine NEGATIVE NEGATIVE   Ketones, ur NEGATIVE NEGATIVE mg/dL   Protein, ur NEGATIVE NEGATIVE mg/dL   Nitrite NEGATIVE NEGATIVE   Leukocytes,Ua NEGATIVE NEGATIVE    Comment: Performed at [facility], [address] [place], [place] [address]    Medications:  Current Facility-Administered Medications  Medication Dose Route Frequency Provider Last Rate Last Admin  . lactated ringers bolus 1,000 mL  1,000 mL Intravenous Once Pricilla LovelessGoldston, Scott, MD       Current Outpatient Medications  Medication Sig Dispense Refill  . amoxicillin-clavulanate (AUGMENTIN) 875-125 MG per tablet Take 1 tablet by mouth 2 (two) times daily. 14 tablet 0  . benztropine (COGENTIN) 1 MG tablet Take 1 mg by mouth 2 (two) times daily.      . cloNIDine (CATAPRES) 0.1 MG tablet Take 0.1 mg by mouth 2 (two) times daily.      . diphenhydrAMINE (BENADRYL) 25 mg capsule Take 25 mg by mouth every 6 (six) hours as needed. For itching     . haloperidol (HALDOL) 10 MG tablet Take 10 mg by mouth 3 (three) times daily.      Marland Kitchen. HYDROcodone-acetaminophen (NORCO/VICODIN) 5-325 MG per tablet Take 1 tablet by mouth every 6 (six) hours as needed for severe pain. 15 tablet 0  . lithium carbonate (ESKALITH) 450 MG CR tablet Take by mouth 3 (three) times daily.    Marland Kitchen. LORazepam (ATIVAN) 2 MG tablet Take 2 mg by mouth at bedtime.      . metFORMIN (GLUCOPHAGE) 500 MG tablet Take by mouth 2 (two) times daily with a meal.    . traZODone (DESYREL) 150 MG tablet Take 150 mg by mouth at bedtime.        Musculoskeletal:   Psychiatric Specialty Exam: Physical Exam  Nursing note reviewed. Psychiatric: She has a normal mood and affect. Her behavior is normal.    Review of Systems  Blood pressure (!) 157/137, pulse 95, temperature 98.4 F (36.9 C), temperature source Oral, resp. rate 20, SpO2 95  %.There is no height or weight on file to calculate BMI.  General Appearance: Casual  Eye Contact:  Fair  Speech:  Clear and Coherent  Volume:  Fluctuating  Mood:  Anxious and Depressed  Affect:  Labile  Thought Process:  Coherent  Orientation:  Full (Time, Place, and Person)  Thought Content:  Logical  Suicidal Thoughts:  No  Homicidal Thoughts:  No  Memory:  Immediate;   Fair Recent;  Fair  Judgement:  Intact  Insight:  Fair  Psychomotor Activity:  Normal  Concentration:  Concentration: Fair  Recall:  [employer] of Knowledge:  Fair  Language:  Fair  Akathisia:  No  Handed:  Right  AIMS (if indicated):     Assets:  Communication Skills Desire for Improvement Resilience Social Support  ADL's:  Intact  Cognition:  WNL  Sleep:      NP spoke to [employer] regarding discharge disposition recommendation. -Consider follow-up with ACT services for additional community outpatient resources -Continue medications as directed -Patient was offered overnight observation however declined   Disposition: No evidence of imminent risk to self or others at present.   Recommend psychiatric Inpatient admission when medically cleared. Supportive therapy provided about ongoing stressors. Refer to IOP. Discussed crisis plan, support from social network, calling 911, coming to the Emergency Department, and calling Suicide Hotline.  This service was provided via telemedicine using a 2-way, interactive audio and video technology.  Names of all persons participating in this telemedicine service and their role in this encounter. Name: [facility] Role: patient   Name: T.Jase Reep Role: NP  Name: Jessica Rios Role: Mother       Oneta Rack, NP 01/16/2020 3:24 PM

## 2020-01-16 NOTE — ED Notes (Signed)
Walked patient to the bathroom to get a urine sample patient couldn't get one patient stated that she will try later

## 2020-01-16 NOTE — ED Triage Notes (Signed)
Pt states she is suicidal but denies specific plan.  States her mother brought her to hospital to be committed.  States she believes someone is trying to poison her in her apartment because of smoke and gas in the air since the beginning of March.

## 2020-03-23 ENCOUNTER — Other Ambulatory Visit: Payer: Self-pay | Admitting: Behavioral Health

## 2020-03-23 ENCOUNTER — Inpatient Hospital Stay (HOSPITAL_COMMUNITY): Admission: AD | Admit: 2020-03-23 | Payer: Medicare (Managed Care) | Source: Intra-hospital | Admitting: Psychiatry

## 2020-03-25 ENCOUNTER — Encounter: Payer: Self-pay | Admitting: Behavioral Health

## 2020-03-25 ENCOUNTER — Inpatient Hospital Stay
Admit: 2020-03-25 | Discharge: 2020-03-28 | DRG: 885 | Disposition: A | Discharge status: Home / Self Care | Payer: Medicare (Managed Care) | Source: Intra-hospital | Attending: Psychiatry | Admitting: Psychiatry

## 2020-03-25 ENCOUNTER — Other Ambulatory Visit: Payer: Self-pay

## 2020-03-25 DIAGNOSIS — E119 Type 2 diabetes mellitus without complications: Secondary | ICD-10-CM

## 2020-03-25 DIAGNOSIS — I10 Essential (primary) hypertension: Secondary | ICD-10-CM

## 2020-03-25 DIAGNOSIS — F17210 Nicotine dependence, cigarettes, uncomplicated: Secondary | ICD-10-CM | POA: Diagnosis present

## 2020-03-25 DIAGNOSIS — R21 Rash and other nonspecific skin eruption: Secondary | ICD-10-CM | POA: Diagnosis present

## 2020-03-25 DIAGNOSIS — Z915 Personal history of self-harm: Secondary | ICD-10-CM | POA: Diagnosis not present

## 2020-03-25 DIAGNOSIS — F3164 Bipolar disorder, current episode mixed, severe, with psychotic features: Principal | ICD-10-CM | POA: Diagnosis present

## 2020-03-25 DIAGNOSIS — F3130 Bipolar disorder, current episode depressed, mild or moderate severity, unspecified: Secondary | ICD-10-CM | POA: Diagnosis present

## 2020-03-25 DIAGNOSIS — F319 Bipolar disorder, unspecified: Secondary | ICD-10-CM | POA: Diagnosis not present

## 2020-03-25 DIAGNOSIS — E669 Obesity, unspecified: Secondary | ICD-10-CM

## 2020-03-25 DIAGNOSIS — G4730 Sleep apnea, unspecified: Secondary | ICD-10-CM | POA: Diagnosis present

## 2020-03-25 DIAGNOSIS — F411 Generalized anxiety disorder: Secondary | ICD-10-CM | POA: Diagnosis present

## 2020-03-25 DIAGNOSIS — K219 Gastro-esophageal reflux disease without esophagitis: Secondary | ICD-10-CM | POA: Diagnosis present

## 2020-03-25 LAB — GLUCOSE, CAPILLARY
Glucose-Capillary: 189 mg/dL — ABNORMAL HIGH (ref 70–99)
Glucose-Capillary: 138 mg/dL — ABNORMAL HIGH (ref 70–99)

## 2020-03-25 MED ORDER — HALOPERIDOL LACTATE 5 MG/ML IJ SOLN: 2.0000 mg | Freq: Four times a day (QID) | INTRAMUSCULAR | Status: DC | PRN

## 2020-03-25 MED ORDER — HALOPERIDOL 5 MG PO TABS
10.0000 mg | ORAL_TABLET | Freq: Two times a day (BID) | ORAL | Status: DC
  Administered 2020-03-25: 10 mg via ORAL
  Filled 2020-03-25: qty 2

## 2020-03-25 MED ORDER — TRAZODONE HCL 100 MG PO TABS
300.0000 mg | ORAL_TABLET | Freq: Every day | ORAL | Status: DC
  Administered 2020-03-25 – 2020-03-27 (×3): 300 mg via ORAL
  Filled 2020-03-25 (×4): qty 3

## 2020-03-25 MED ORDER — LORAZEPAM 1 MG PO TABS
0.5000 mg | ORAL_TABLET | Freq: Three times a day (TID) | ORAL | Status: DC
  Administered 2020-03-25: 0.5 mg via ORAL
  Filled 2020-03-25: qty 1

## 2020-03-25 MED ORDER — DIPHENHYDRAMINE HCL 50 MG/ML IJ SOLN
50.0000 mg | Freq: Four times a day (QID) | INTRAMUSCULAR | Status: DC | PRN
  Administered 2020-03-25: 50 mg via INTRAMUSCULAR
  Filled 2020-03-25: qty 1

## 2020-03-25 MED ORDER — BENZTROPINE MESYLATE 1 MG PO TABS
0.5000 mg | ORAL_TABLET | Freq: Two times a day (BID) | ORAL | Status: DC
  Administered 2020-03-25 – 2020-03-28 (×6): 0.5 mg via ORAL
  Filled 2020-03-25 (×6): qty 1

## 2020-03-25 MED ORDER — METFORMIN HCL 500 MG PO TABS
1000.0000 mg | ORAL_TABLET | Freq: Every day | ORAL | Status: DC
  Administered 2020-03-26 – 2020-03-28 (×3): 1000 mg via ORAL
  Filled 2020-03-25 (×3): qty 2

## 2020-03-25 MED ORDER — PANTOPRAZOLE SODIUM 40 MG PO TBEC
40.0000 mg | DELAYED_RELEASE_TABLET | Freq: Every day | ORAL | Status: DC
  Administered 2020-03-25 – 2020-03-28 (×4): 40 mg via ORAL
  Filled 2020-03-25 (×4): qty 1

## 2020-03-25 MED ORDER — HALOPERIDOL LACTATE 5 MG/ML IJ SOLN
5.0000 mg | Freq: Four times a day (QID) | INTRAMUSCULAR | Status: DC | PRN
  Administered 2020-03-25: 5 mg via INTRAMUSCULAR
  Filled 2020-03-25: qty 1

## 2020-03-25 MED ORDER — INSULIN ASPART 100 UNIT/ML ~~LOC~~ SOLN: 0.0000 [IU] | Freq: Every day | SUBCUTANEOUS | Status: DC

## 2020-03-25 MED ORDER — LISINOPRIL 20 MG PO TABS
20.0000 mg | ORAL_TABLET | Freq: Every day | ORAL | Status: DC
  Administered 2020-03-26 – 2020-03-28 (×2): 20 mg via ORAL
  Filled 2020-03-25 (×3): qty 1

## 2020-03-25 MED ORDER — INSULIN ASPART 100 UNIT/ML ~~LOC~~ SOLN
0.0000 [IU] | Freq: Three times a day (TID) | SUBCUTANEOUS | Status: DC
  Administered 2020-03-25 – 2020-03-26 (×2): 2 [IU] via SUBCUTANEOUS
  Administered 2020-03-26 – 2020-03-27 (×3): 1 [IU] via SUBCUTANEOUS
  Administered 2020-03-27 – 2020-03-28 (×3): 2 [IU] via SUBCUTANEOUS
  Administered 2020-03-28: 1 [IU] via SUBCUTANEOUS
  Filled 2020-03-25 (×9): qty 1

## 2020-03-25 MED ORDER — LINAGLIPTIN 5 MG PO TABS
5.0000 mg | ORAL_TABLET | Freq: Every day | ORAL | Status: DC
  Administered 2020-03-26 – 2020-03-28 (×3): 5 mg via ORAL
  Filled 2020-03-25 (×3): qty 1

## 2020-03-25 MED ORDER — HALOPERIDOL 1 MG PO TABS: 2.0000 mg | ORAL_TABLET | Freq: Four times a day (QID) | ORAL | Status: DC | PRN

## 2020-03-25 MED ORDER — LORAZEPAM 2 MG/ML IJ SOLN
2.0000 mg | Freq: Four times a day (QID) | INTRAMUSCULAR | Status: DC | PRN
  Administered 2020-03-25: 2 mg via INTRAMUSCULAR
  Filled 2020-03-25: qty 1

## 2020-03-25 MED ORDER — SIMVASTATIN 40 MG PO TABS
80.0000 mg | ORAL_TABLET | Freq: Every day | ORAL | Status: DC
  Administered 2020-03-25 – 2020-03-27 (×3): 80 mg via ORAL
  Filled 2020-03-25 (×3): qty 2

## 2020-03-25 NOTE — Tx Team (Signed)
Initial Treatment Plan 03/25/2020 4:48 PM Jessica Rios INO:[id number]    PATIENT STRESSORS: Health problems Marital or family conflict   PATIENT STRENGTHS: Capable of independent living Motivation for treatment/growth   PATIENT IDENTIFIED PROBLEMS: Ineffective coping skills  Lack of positive support system                   DISCHARGE CRITERIA:  Improved stabilization in mood, thinking, and/or behavior Motivation to continue treatment in a less acute level of care Verbal commitment to aftercare and medication compliance  PRELIMINARY DISCHARGE PLAN: Outpatient therapy Return to previous living arrangement Return to previous work or school arrangements  PATIENT/FAMILY INVOLVEMENT: This treatment plan has been presented to and reviewed with the patient, Jessica Rios, and/or family memberI.  The patient and family have been given the opportunity to ask questions and make suggestions.  Sharin Mons, RN 03/25/2020, 4:48 PM

## 2020-03-25 NOTE — Progress Notes (Signed)
Pt came from [place] and is a 34 yr old female. Pt had SI to OD on home medications. Pt has hx of multiple suicide attempts. Last attempt of suicide was r/t feeling lonely and having no one to talk to about her stresses. Pt frequently argues with parents which is stressful to her. Pt has hx of self mutilation by punching through chicken wire, cutting , and putting cigarettes out on self. Pt has family hx of mental illness. Pt's biggest stressor is "people talking about her and not listening to her." Pt is unaware of how many hours of sleep she gets at night. Pt endorses SI/HI and AVH at this time. Pt reports she's seeing niggers and hearing people talking. Pt is SI/HI to choke herself and staff at this time.   Pt's mood is angry, agitated, and irritable. Pt's mood is very labile. Pt refuses to sign papers at this time.   Pt's skin assessment preformed upon admission. Pt's skin is red, dry, flaky, and peeling. Pt has cracking on Left heel and a sore on Right great toe of Right foot.   Pt's mood goes from angry and aggressive to loud, whiney, and almost tearful.

## 2020-03-25 NOTE — H&P (Signed)
Psychiatric Admission Assessment Adult  Patient Identification: Jessica Rios MRN:  [id number] Date of Evaluation:  03/25/2020 Chief Complaint:  Bipolar 1 disorder (HCC) [F31.9] Principal Diagnosis: Bipolar 1 disorder (HCC) Diagnosis:  Principal Problem:   Bipolar 1 disorder (HCC) Active Problems:   Diabetes (HCC)   Essential hypertension   Obesity   Rash and nonspecific skin eruption  History of Present Illness: Patient seen and chart reviewed.  This is a 34 year old woman who was transferred to [place] from [facility].  She had been there for a couple days after an initial presentation with anxiety and suicidal ideation.  Records are a little inconsistent but it sounds like she was voicing suicidal ideation when she first presented there but not throughout her time there.  Patient tells me she went there for anxiety symptoms after being seen [facility].  She says she has frequent panic attacks which she cannot describe in any more detail.  She also endorses frequent auditory hallucinations at times telling her to kill her self.  Both of these symptoms are chronic and longstanding and not by her report related to any particular stress.  Mood is always bad.  Sleep is always excessive and she says she does little most of the time but lay around in bed.  Appetite is normal.  Patient endorses having auditory hallucinations now but did not appear to be responding to internal stimuli during our conversation.  She is covered with an erythematous rash which includes extensive skin peeling especially on her hands.  She tells me this has been present ever since she went to [facility] for an overdose of ibuprofen and is getting better.  Patient's current medications appeared to be Haldol 10 twice a day, Topamax 50 mg 3 times a day trazodone 300 mg at night Ativan half milligram 3 times a day Cogentin 1/2 mg twice a day for psychiatric condition.  Not clear to me how long the Topamax has been there.   There seem to have been some recent changes in that she had been on oxcarbazepine which seems to have been discontinued.  Patient herself is a poor historian about the details of her medicine.  She states that she is married lives with her husband but talks about how her mother tightly controls much of her life Associated Signs/Symptoms: Depression Symptoms:  depressed mood, fatigue, difficulty concentrating, hopelessness, suicidal thoughts without plan, anxiety, (Hypo) Manic Symptoms:  Impulsivity, Anxiety Symptoms:  Excessive Worry, Panic Symptoms, Psychotic Symptoms:  Hallucinations: Auditory Paranoia, PTSD Symptoms: Negative Total Time spent with patient: 1 hour  Past Psychiatric History: Limited records available.  We have records of some previous hospital treatment in 2012 and we have the patient's report that she has had psychiatric treatment since adolescence.  She states she has had multiple hospitalizations.  Most recently at [facility]  We do not have access to the records from [facility].  Patient reports she had a relatively recent presentation there with an overdose of ibuprofen.  She says she is treated by and followed up by a doctor at the [facility] clinic near [facility].  Multiple hospitalizations by her report multiple suicide attempts including suicidal behaviors while in hospitals.  Varying diagnoses including bipolar disorder depression and personality disorder.  Is the patient at risk to self? Yes.    Has the patient been a risk to self in the past 6 months? Yes.    Has the patient been a risk to self  within the distant past? Yes.    Is the patient a risk to others? No.  Has the patient been a risk to others in the past 6 months? No.  Has the patient been a risk to others within the distant past? No.   Prior Inpatient Therapy:   Prior Outpatient Therapy:    Alcohol Screening: Patient refused Alcohol Screening Tool: Yes Alcohol Brief  Interventions/Follow-up: Patient Refused Substance Abuse History in the last 12 months:  No. Consequences of Substance Abuse: Negative Previous Psychotropic Medications: Yes  Psychological Evaluations: Yes  Past Medical History:  Past Medical History:  Diagnosis Date  . Bipolar affective (HCC)   . Diabetes mellitus   . High cholesterol   . Hypertension   . Sleep apnea     Past Surgical History:  Procedure Laterality Date  . TONSILLECTOMY     Family History: History reviewed. No pertinent family history. Family Psychiatric  History: Patient reports extensive history of mental illness especially bipolar disorder and depression in her family Tobacco Screening: Have you used any form of tobacco in the last 30 days? (Cigarettes, Smokeless Tobacco, Cigars, and/or Pipes): Patient Refused Screening Social History:  Social History   Substance and Sexual Activity  Alcohol Use No     Social History   Substance and Sexual Activity  Drug Use No    Additional Social History:                           Allergies:   Allergies  Allergen Reactions  . Effexor [Venlafaxine Hydrochloride] Anxiety  . Geodon [Ziprasidone Hydrochloride] Rash  . Tegretol [Carbamazepine] Anxiety   Lab Results: No results found for this or any previous visit (from the past 48 hour(s)).  Blood Alcohol level:  Lab Results  Component Value Date   ETH <10 01/16/2020   ETH <5 08/02/2015    Metabolic Disorder Labs:  Lab Results  Component Value Date   HGBA1C  11/16/2010    5.2 (NOTE)                                                                       According to the ADA Clinical Practice Recommendations for 2011, when HbA1c is used as a screening test:   >=6.5%   Diagnostic of Diabetes Mellitus           (if abnormal result  is confirmed)  5.7-6.4%   Increased risk of developing Diabetes Mellitus  References:Diagnosis and Classification of Diabetes Mellitus,Diabetes Care,2011,34(Suppl 1):S62-S69  and Standards of Medical Care in         Diabetes - 2011,Diabetes Care,2011,34  (Suppl 1):S11-S61.   MPG 103 11/16/2010   No results found for: PROLACTIN Lab Results  Component Value Date   CHOL  11/17/2010    142        ATP III CLASSIFICATION:  <200     mg/dL   Desirable  132-440  mg/dL   Borderline High  >=102    mg/dL   High          TRIG 725 (H) 11/17/2010   HDL 19 (L) 11/17/2010   CHOLHDL 7.5 11/17/2010   VLDL 43 (H) 11/17/2010   LDLCALC  11/17/2010  80        Total Cholesterol/HDL:CHD Risk Coronary Heart Disease Risk Table                     Men   Women  1/2 Average Risk   3.4   3.3  Average Risk       5.0   4.4  2 X Average Risk   9.6   7.1  3 X Average Risk  23.4   11.0        Use the calculated Patient Ratio above and the CHD Risk Table to determine the patient's CHD Risk.        ATP III CLASSIFICATION (LDL):  <100     mg/dL   Optimal  [id number]  mg/dL   Near or Above                    Optimal  130-159  mg/dL   Borderline  [id number]  mg/dL   High  >132>190     mg/dL   Very High    Current Medications: Current Facility-Administered Medications  Medication Dose Route Frequency Provider Last Rate Last Admin  . benztropine (COGENTIN) tablet 0.5 mg  0.5 mg Oral BID Cala Kruckenberg T, MD      . haloperidol (HALDOL) tablet 10 mg  10 mg Oral BID Ova Gillentine T, MD      . insulin aspart (novoLOG) injection 0-5 Units  0-5 Units Subcutaneous QHS Braxton Weisbecker T, MD      . insulin aspart (novoLOG) injection 0-9 Units  0-9 Units Subcutaneous TID WC Kawika Bischoff T, MD      . linagliptin (TRADJENTA) tablet 5 mg  5 mg Oral Daily Maghen Group T, MD      . lisinopril (ZESTRIL) tablet 20 mg  20 mg Oral Daily Antwain Caliendo T, MD      . LORazepam (ATIVAN) tablet 0.5 mg  0.5 mg Oral TID Azan Maneri, Jackquline DenmarkJohn T, MD      . Melene Muller[START ON 03/26/2020] metFORMIN (GLUCOPHAGE) tablet 1,000 mg  1,000 mg Oral Q breakfast Felicia Both T, MD      . pantoprazole (PROTONIX) EC tablet 40 mg  40 mg Oral  Daily Orin Eberwein T, MD      . simvastatin (ZOCOR) tablet 80 mg  80 mg Oral q1800 Damoney Julia T, MD      . traZODone (DESYREL) tablet 300 mg  300 mg Oral QHS Azka Steger T, MD       PTA Medications: Medications Prior to Admission  Medication Sig Dispense Refill Last Dose  . amoxicillin-clavulanate (AUGMENTIN) 875-125 MG per tablet Take 1 tablet by mouth 2 (two) times daily. 14 tablet 0   . benztropine (COGENTIN) 1 MG tablet Take 1 mg by mouth 2 (two) times daily.       . cloNIDine (CATAPRES) 0.1 MG tablet Take 0.1 mg by mouth 2 (two) times daily.       . diphenhydrAMINE (BENADRYL) 25 mg capsule Take 25 mg by mouth every 6 (six) hours as needed. For itching      . haloperidol (HALDOL) 10 MG tablet Take 10 mg by mouth 3 (three) times daily.       Marland Kitchen. HYDROcodone-acetaminophen (NORCO/VICODIN) 5-325 MG per tablet Take 1 tablet by mouth every 6 (six) hours as needed for severe pain. 15 tablet 0   . lithium carbonate (ESKALITH) 450 MG CR tablet Take by mouth 3 (three) times daily.     .Marland Kitchen  LORazepam (ATIVAN) 2 MG tablet Take 2 mg by mouth at bedtime.       . metFORMIN (GLUCOPHAGE) 500 MG tablet Take by mouth 2 (two) times daily with a meal.     . traZODone (DESYREL) 150 MG tablet Take 150 mg by mouth at bedtime.         Musculoskeletal: Strength & Muscle Tone: within normal limits Gait & Station: normal Patient leans: N/A  Psychiatric Specialty Exam: Physical Exam Vitals and nursing note reviewed.  Constitutional:      Appearance: She is well-developed.  HENT:     Head: Normocephalic and atraumatic.  Eyes:     Conjunctiva/sclera: Conjunctivae normal.     Pupils: Pupils are equal, round, and reactive to light.  Cardiovascular:     Heart sounds: Normal heart sounds.  Pulmonary:     Effort: Pulmonary effort is normal.  Abdominal:     Palpations: Abdomen is soft.  Musculoskeletal:        General: Normal range of motion.     Cervical back: Normal range of motion.  Skin:    General:  Skin is warm and dry.       Neurological:     Mental Status: She is alert.  Psychiatric:        Attention and Perception: Attention normal.        Mood and Affect: Mood is anxious. Affect is labile.        Speech: Speech normal.        Behavior: Behavior is agitated. Behavior is not aggressive.        Thought Content: Thought content is paranoid. Thought content includes suicidal ideation. Thought content does not include homicidal ideation. Thought content does not include suicidal plan.        Cognition and Memory: Cognition is impaired.        Judgment: Judgment is impulsive.     Review of Systems  Constitutional: Negative.   HENT: Negative.   Eyes: Negative.   Respiratory: Negative.   Cardiovascular: Negative.   Gastrointestinal: Negative.   Musculoskeletal: Negative.   Skin: Negative.   Neurological: Negative.   Psychiatric/Behavioral: Positive for behavioral problems and suicidal ideas. The patient is nervous/anxious.     Blood pressure (!) 129/101, pulse 75, temperature 98.1 F (36.7 C), temperature source Oral, resp. rate 19, height 5\' 9"  (1.753 m), weight (!) 186.4 kg, SpO2 97 %.Body mass index is 60.69 kg/m.  General Appearance: Disheveled  Eye Contact:  Fair  Speech:  Clear and Coherent  Volume:  Normal  Mood:  Anxious and Depressed  Affect:  Congruent  Thought Process:  Disorganized  Orientation:  Full (Time, Place, and Person)  Thought Content:  Logical, Rumination and Tangential  Suicidal Thoughts:  Yes.  without intent/plan  Homicidal Thoughts:  No  Memory:  Immediate;   Fair Recent;   Poor Remote;   Poor  Judgement:  Impaired  Insight:  Shallow  Psychomotor Activity:  Decreased  Concentration:  Concentration: Poor  Recall:  Poor  Fund of Knowledge:  Fair  Language:  Fair  Akathisia:  No  Handed:  Right  AIMS (if indicated):     Assets:  Desire for Improvement Financial Resources/Insurance Housing  ADL's:  Impaired  Cognition:  Impaired,  Mild   Sleep:       Treatment Plan Summary: Daily contact with patient to assess and evaluate symptoms and progress in treatment, Medication management and Plan Psychiatrically the patient appears to suffer from chronic anxiety and  mood problems with intermittent behavior problems including suicide attempts and impulsivity.  She was calm during the conversation with me and showed reasonable insight although she is a poor historian regarding her medicine.  I am going to leave her on the Haldol and Cogentin which appear to be of longstanding use as well as trazodone.  I am discontinuing the Topamax because it is unclear to me what medicine could have caused the drug rash that is all over her body right now.  Patient will be on 15-minute checks and be encouraged to engage in assessment in groups.  Patient has diabetes and will be continued on Metformin.  Hospital does not carry Januvia so she will have normal dose of Tradjenta.  I have written for glycemic orders.  Continue the lisinopril for blood pressure.  Patient tells me she has suicidal thoughts but that she "promises" that she will not act on it in the hospital.  Nursing is aware of the patient's current behaviors.  Observation Level/Precautions:  15 minute checks  Laboratory:  UDS  Psychotherapy:    Medications:    Consultations:    Discharge Concerns:    Estimated LOS:  Other:     Physician Treatment Plan for Primary Diagnosis: Bipolar 1 disorder ([place]) Long Term Goal(s): Improvement in symptoms so as ready for discharge  Short Term Goals: Ability to verbalize feelings will improve, Ability to disclose and discuss suicidal ideas and Ability to demonstrate self-control will improve  Physician Treatment Plan for Secondary Diagnosis: Principal Problem:   Bipolar 1 disorder ([place]) Active Problems:   Diabetes ([place])   Essential hypertension   Obesity   Rash and nonspecific skin eruption  Long Term Goal(s): Improvement in symptoms so as ready for  discharge  Short Term Goals: Ability to identify and develop effective coping behaviors will improve, Ability to maintain clinical measurements within normal limits will improve and Compliance with prescribed medications will improve  I certify that inpatient services furnished can reasonably be expected to improve the patient's condition.    Alethia Berthold, MD 6/25/20214:56 PM

## 2020-03-25 NOTE — Progress Notes (Signed)
Patient was admitted to the unit and does not have any clothing of her own, however, she can not fit any of the scrubs provided by the hospital. This writer called supply chain and had some gowns sent down for her comfort and privacy. Administration and MD will be notified of these findings.
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
## 2020-03-26 LAB — GLUCOSE, CAPILLARY
Glucose-Capillary: 185 mg/dL — ABNORMAL HIGH (ref 70–99)
Glucose-Capillary: 124 mg/dL — ABNORMAL HIGH (ref 70–99)
Glucose-Capillary: 143 mg/dL — ABNORMAL HIGH (ref 70–99)
Glucose-Capillary: 144 mg/dL — ABNORMAL HIGH (ref 70–99)

## 2020-03-26 LAB — LIPID PANEL
Cholesterol: 202 mg/dL — ABNORMAL HIGH (ref 0–200)
HDL: 28 mg/dL — ABNORMAL LOW (ref >40)
LDL Cholesterol: 116 mg/dL — ABNORMAL HIGH (ref 0–99)
Total CHOL/HDL Ratio: 7.2 RATIO
Triglycerides: 292 mg/dL — ABNORMAL HIGH (ref <150)
VLDL: 58 mg/dL — ABNORMAL HIGH (ref 0–40)

## 2020-03-26 LAB — HEMOGLOBIN A1C
Hgb A1c MFr Bld: 6.2 % — ABNORMAL HIGH (ref 4.8–5.6)
Mean Plasma Glucose: 131.24 mg/dL

## 2020-03-26 LAB — TSH: TSH: 1.047 u[IU]/mL (ref 0.350–4.500)

## 2020-03-26 MED ORDER — HALOPERIDOL 5 MG PO TABS
10.0000 mg | ORAL_TABLET | Freq: Two times a day (BID) | ORAL | Status: DC
  Administered 2020-03-26 – 2020-03-27 (×2): 10 mg via ORAL
  Filled 2020-03-26 (×2): qty 2

## 2020-03-26 MED ORDER — CLONAZEPAM 0.5 MG PO TABS
0.5000 mg | ORAL_TABLET | Freq: Three times a day (TID) | ORAL | Status: DC
  Administered 2020-03-26 – 2020-03-28 (×7): 0.5 mg via ORAL
  Filled 2020-03-26 (×7): qty 1

## 2020-03-26 NOTE — Plan of Care (Signed)
  Problem: Education: Goal: Utilization of techniques to improve thought processes will improve Outcome: Not Progressing Goal: Knowledge of the prescribed therapeutic regimen will improve Outcome: Not Progressing   Problem: Activity: Goal: Interest or engagement in leisure activities will improve Outcome: Not Progressing Goal: Imbalance in normal sleep/wake cycle will improve Outcome: Not Progressing   Problem: Coping: Goal: Coping ability will improve Outcome: Not Progressing Goal: Will verbalize feelings Outcome: Not Progressing   Problem: Health Behavior/Discharge Planning: Goal: Ability to make decisions will improve Outcome: Not Progressing Goal: Compliance with therapeutic regimen will improve Outcome: Not Progressing   Problem: Role Relationship: Goal: Will demonstrate positive changes in social behaviors and relationships Outcome: Not Progressing   Problem: Safety: Goal: Ability to disclose and discuss suicidal ideas will improve Outcome: Not Progressing Goal: Ability to identify and utilize support systems that promote safety will improve Outcome: Not Progressing   Problem: Self-Concept: Goal: Will verbalize positive feelings about self Outcome: Not Progressing Goal: Level of anxiety will decrease Outcome: Not Progressing   Problem: Education: Goal: Ability to state activities that reduce stress will improve Outcome: Not Progressing   Problem: Coping: Goal: Ability to identify and develop effective coping behavior will improve Outcome: Not Progressing   Problem: Self-Concept: Goal: Ability to identify factors that promote anxiety will improve Outcome: Not Progressing Goal: Level of anxiety will decrease Outcome: Not Progressing Goal: Ability to modify response to factors that promote anxiety will improve Outcome: Not Progressing   Problem: Education: Goal: Knowledge of General Education information will improve Description: Including pain rating  scale, medication(s)/side effects and non-pharmacologic comfort measures Outcome: Not Progressing   Problem: Health Behavior/Discharge Planning: Goal: Ability to manage health-related needs will improve Outcome: Not Progressing   Problem: Clinical Measurements: Goal: Ability to maintain clinical measurements within normal limits will improve Outcome: Not Progressing Goal: Will remain free from infection Outcome: Not Progressing Goal: Diagnostic test results will improve Outcome: Not Progressing Goal: Respiratory complications will improve Outcome: Not Progressing Goal: Cardiovascular complication will be avoided Outcome: Not Progressing   Problem: Activity: Goal: Risk for activity intolerance will decrease Outcome: Not Progressing   Problem: Nutrition: Goal: Adequate nutrition will be maintained Outcome: Not Progressing   Problem: Coping: Goal: Level of anxiety will decrease Outcome: Not Progressing   Problem: Elimination: Goal: Will not experience complications related to bowel motility Outcome: Not Progressing Goal: Will not experience complications related to urinary retention Outcome: Not Progressing   Problem: Pain Managment: Goal: General experience of comfort will improve Outcome: Not Progressing   Problem: Safety: Goal: Ability to remain free from injury will improve Outcome: Not Progressing   Problem: Skin Integrity: Goal: Risk for impaired skin integrity will decrease Outcome: Not Progressing

## 2020-03-26 NOTE — BHH Group Notes (Signed)
B[facility] Group Notes:  (Nursing/MHT/Case Management/Adjunct)  Date:  03/26/2020  Time:  10:20 PM  Type of Therapy:  Group Therapy  Participation Level:  Did Not Attend   Mayra Neer 03/26/2020, 10:20 PM

## 2020-03-26 NOTE — [facility] Suicide Risk Assessment (Signed)
[facility] INPATIENT:  Family/Significant Other Suicide Prevention Education  Suicide Prevention Education:  Education Completed; Caroll Rancher, MOTHER,  (name of family member/significant other) has been identified by the patient as the family member/significant other with whom the patient will be residing, and identified as the person(s) who will aid the patient in the event of a mental health crisis (suicidal ideations/suicide attempt).  With written consent from the patient, the family member/significant other has been provided the following suicide prevention education, prior to the and/or following the discharge of the patient.  The suicide prevention education provided includes the following:  Suicide risk factors  Suicide prevention and interventions  National Suicide Hotline telephone number  [facility] assessment telephone number  [facility] Emergency Assistance 911  [facility] and/or Residential Mobile Crisis Unit telephone number  Request made of family/significant other to:  Remove weapons (e.g., guns, rifles, knives), all items previously/currently identified as safety concern.    Remove drugs/medications (over-the-counter, prescriptions, illicit drugs), all items previously/currently identified as a safety concern.  The family member/significant other verbalizes understanding of the suicide prevention education information provided.  The family member/significant other agrees to remove the items of safety concern listed above.  Mother reports there are no guns in patient's home. Mother verbalized being familiar with SPE. Mother shared pt's sister knows more about situation but that over the last two months patient has been "hearing voices, crying all the time, calling 911 constantly and believes we should do everything for her."  Shellia Cleverly 03/26/2020, 2:45 PM

## 2020-03-26 NOTE — Progress Notes (Signed)
Patient presents with labile mood. Observed mood ranged from crying and acting childlike in nature to verbally aggressive slamming phone into receiving cradle when agitated while talking on the phone. She has come to the nurses station several times for various reasons  and each time she was aggressive and demanding in her request for needed items. Most times cursing with derogatory and threatening comments to staff. Patient received IM medications to help with aggression and agitation to which she received and tolerated without incident.   She became upset and agitated again with the length of time it took for her to get her CPAP, but once she received the item she was in a better mood and apologized for her earlier behavior.  Blood sugar level 138 required no coverage needed. All other prescribed meds were received and taken without incident. She remains safe on the unit with 15 minute safety checks. Patient informed to contact staff with any concerns.
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
## 2020-03-26 NOTE — [facility] Group Notes (Signed)
LCSW Group Therapy Note  03/26/2020   1:00-1:40PM    Type of Therapy and Topic:  Group Therapy: Anger Cues and Responses  Participation Level:  Active   Description of Group:   In this group, patients learned how to recognize the physical, cognitive, emotional, and behavioral responses they have to anger-provoking situations.  They identified a recent time they became angry and how they reacted.  They analyzed how their reaction was possibly beneficial and how it was possibly unhelpful.  The group discussed a variety of healthier coping skills that could help with such a situation in the future.  Focus was placed on how helpful it is to recognize the underlying emotions to our anger, because working on those can lead to a more permanent solution as well as our ability to focus on the important rather than the urgent.  Therapeutic Goals: 1. Patients will remember their last incident of anger and how they felt emotionally and physically, what their thoughts were at the time, and how they behaved. 2. Patients will identify how their behavior at that time worked for them, as well as how it worked against them. 3. Patients will explore possible new behaviors to use in future anger situations. 4. Patients will learn that anger itself is normal and cannot be eliminated, and that healthier reactions can assist with resolving conflict rather than worsening situations.  Summary of Patient Progress:  The patient shared that her anger trigger is when people yell and scream at her. Patient was at times off topic and distracting to her peers. Patient did request a journal and was given one by CSW. Patient would agree with her peers in discussion.   Therapeutic Modalities:   Cognitive Behavioral Therapy  Shellia Cleverly
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
## 2020-03-26 NOTE — Plan of Care (Signed)
Patient is irritable,loud and needy at times.Multiple crying spells for no specific reasons.Denies SI,HI and AVH.Patient stated that she is sad because people are not listening to her.Compliant with medications.Attended groups.Appetite and energy level good.Support and encouragement given.

## 2020-03-27 LAB — GLUCOSE, CAPILLARY
Glucose-Capillary: 184 mg/dL — ABNORMAL HIGH (ref 70–99)
Glucose-Capillary: 138 mg/dL — ABNORMAL HIGH (ref 70–99)
Glucose-Capillary: 155 mg/dL — ABNORMAL HIGH (ref 70–99)

## 2020-03-27 LAB — PROLACTIN: Prolactin: 107 ng/mL — ABNORMAL HIGH (ref 4.8–23.3)

## 2020-03-27 MED ORDER — HALOPERIDOL 5 MG PO TABS: 15.0000 mg | ORAL_TABLET | Freq: Every day | ORAL | Status: DC

## 2020-03-27 NOTE — Progress Notes (Addendum)
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 Bipolar affective ([place])   . Diabetes mellitus   . High cholesterol   . Hypertension   . Sleep apnea     Past Surgical History:  Procedure Laterality Date  . TONSILLECTOMY     Family History: History reviewed. No pertinent family history. Family Psychiatric  History: already discussed   Social History:  Social History   Substance and Sexual Activity  Alcohol Use No     Social History   Substance and Sexual Activity  Drug Use No    Social History   Socioeconomic History  . Marital status: Legally Separated    Spouse name: Not on file  . Number of children: Not on file  . Years of education: Not on file  . Highest education level: Not on file  Occupational History  . Not on file  Tobacco Use  . Smoking status: Current Every Day Smoker    Packs/day: 1.00    Types: Cigarettes  . Smokeless tobacco: Never Used  Substance and Sexual Activity  . Alcohol use: No  . Drug use: No  . Sexual activity: Not Currently  Other Topics Concern  . Not on file  Social History Narrative  . Not on file   Social Determinants of Health   Financial Resource Strain:   . Difficulty of Paying Living Expenses:   Food Insecurity:   . Worried About Charity fundraiser in the Last Year:   . Arboriculturist in the Last Year:    Transportation Needs:   . Film/video editor (Medical):   Marland Kitchen Lack of Transportation (Non-Medical):   Physical Activity:   . Days of Exercise per Week:   . Minutes of Exercise per Session:   Stress:   . Feeling of Stress :   Social Connections:   . Frequency of Communication with Friends and Family:   . Frequency of Social Gatherings with Friends and Family:   . Attends Religious Services:   . Active Member of Clubs or Organizations:   . Attends Archivist Meetings:   Marland Kitchen Marital Status:    Additional Social History:   She is still labile and volatile but it is better Today.  Better --in general with mood anxiety and all after speaking at length with husband ---and mom   She needs a second med including ---antidepressants and perhaps another mood stabilizer but she is too labile --and does not process risks and benefits She has not TCA's ----yet but could be helpful but she has some SI risk for this  She claims severe side effects with many meds -----/but it needs to --be reviewed                           Sleep:   Better   Appetite: excessive  Needs Surgery consult for morbid   Current Medications: Current Facility-Administered Medications  Medication Dose Route Frequency Provider Last Rate Last Admin  . benztropine (COGENTIN) tablet 0.5 mg  0.5 mg Oral BID Clapacs, Jackquline Denmark, MD   0.5 mg at 03/27/20 0809  . clonazePAM (KLONOPIN) tablet 0.5 mg  0.5 mg Oral TID Roselind Messier, MD   0.5 mg at 03/27/20 1135  . diphenhydrAMINE (BENADRYL) injection 50 mg  50 mg Intramuscular Q6H PRN Clapacs, John T, MD   50 mg at 03/25/20 2100  . haloperidol (HALDOL) tablet 10 mg  10 mg Oral BID Roselind Messier, MD   10 mg at 03/27/20 0809  . haloperidol lactate (HALDOL) injection 5 mg  5 mg Intramuscular Q6H PRN Clapacs, Jackquline Denmark, MD   5 mg at 03/25/20 2100  . insulin aspart (novoLOG) injection 0-5 Units  0-5 Units Subcutaneous QHS Clapacs, John T, MD      . insulin aspart  (novoLOG) injection 0-9 Units  0-9 Units Subcutaneous TID WC Clapacs, Jackquline Denmark, MD   1 Units at 03/27/20 1135  . linagliptin (TRADJENTA) tablet 5 mg  5 mg Oral Daily Clapacs, Jackquline Denmark, MD   5 mg at 03/27/20 0809  . lisinopril (ZESTRIL) tablet 20 mg  20 mg Oral Daily Clapacs, Jackquline Denmark, MD   20 mg at 03/26/20 0748  . LORazepam (ATIVAN) injection 2 mg  2 mg Intramuscular Q6H PRN Clapacs, Jackquline Denmark, MD   2 mg at 03/25/20 2100  . metFORMIN (GLUCOPHAGE) tablet 1,000 mg  1,000 mg Oral Q breakfast Clapacs, Jackquline Denmark, MD   1,000 mg at 03/27/20 0809  . pantoprazole (PROTONIX) EC tablet 40 mg  40 mg Oral Daily Clapacs, Jackquline Denmark, MD   40 mg at 03/27/20 0809  . simvastatin (ZOCOR) tablet 80 mg  80 mg Oral q1800 Clapacs, Jackquline Denmark, MD   80 mg at 03/26/20 1655  . traZODone (DESYREL) tablet 300 mg  300 mg Oral QHS Clapacs, Jackquline Denmark, MD   300 mg at 03/26/20 2110    Lab Results:  Results for orders placed or performed during the hospital encounter of 03/25/20 (from the past 48 hour(s))  Glucose, capillary     Status: Abnormal   Collection Time: 03/25/20  5:15 PM  Result Value Ref Range   Glucose-Capillary 189 (H) 70 - 99 mg/dL    Comment: Glucose reference range applies only to samples taken after fasting for at least 8 hours.  Glucose, capillary     Status: Abnormal   Collection Time: 03/25/20  8:37 PM  Result Value Ref Range   Glucose-Capillary 138 (H) 70 - 99 mg/dL    Comment: Glucose reference range applies only to samples taken after fasting for at least 8 hours.   Comment 1 Notify RN   Glucose, capillary     Status: Abnormal   Collection Time: 03/26/20  7:00 AM  Result Value Ref Range   Glucose-Capillary 185 (H) 70 - 99 mg/dL    Comment: Glucose reference range applies only to samples taken after fasting for at least 8 hours.   Comment 1 Notify RN   Hemoglobin A1c     Status: Abnormal   Collection Time: 03/26/20 10:56 AM  Result Value Ref Range   Hgb A1c MFr Bld 6.2 (H) 4.8 - 5.6 %    Comment: (NOTE) Pre  diabetes:          5.7%-6.4%  Diabetes:              >  6.4%  Glycemic control for   <7.0% adults with diabetes    Mean Plasma Glucose 131.24 mg/dL    Comment: Performed at [facility], [address], [place], [place] [address]  Lipid panel     Status: Abnormal   Collection Time: 03/26/20 10:56 AM  Result Value Ref Range   Cholesterol 202 (H) 0 - 200 mg/dL   Triglycerides 573 (H) <150 mg/dL   HDL 28 (L) >22 mg/dL   Total CHOL/HDL Ratio 7.2 RATIO   VLDL 58 (H) 0 - 40 mg/dL   LDL Cholesterol 025 (H) 0 - 99 mg/dL    Comment:        Total Cholesterol/HDL:CHD Risk Coronary Heart Disease Risk Table                     Men   Women  1/2 Average Risk   3.4   3.3  Average Risk       5.0   4.4  2 X Average Risk   9.6   7.1  3 X Average Risk  23.4   11.0        Use the calculated Patient Ratio above and the CHD Risk Table to determine the patient's CHD Risk.        ATP III CLASSIFICATION (LDL):  <100     mg/dL   Optimal  427-062  mg/dL   Near or Above                    Optimal  130-159  mg/dL   Borderline  376-283  mg/dL   High  >151     mg/dL   Very High Performed at [facility], [address] [place], [place] [address]   TSH     Status: None   Collection Time: 03/26/20 10:56 AM  Result Value Ref Range   TSH 1.047 0.350 - 4.500 uIU/mL    Comment: Performed by a 3rd Generation assay with a functional sensitivity of <=0.01 uIU/mL. Performed at [facility], [address] [place], [place] [address]   Glucose, capillary     Status: Abnormal   Collection Time: 03/26/20 11:41 AM  Result Value Ref Range   Glucose-Capillary 124 (H) 70 - 99 mg/dL    Comment: Glucose reference range applies only to samples taken after fasting for at least 8 hours.  Glucose, capillary     Status: Abnormal   Collection Time: 03/26/20  4:11 PM  Result Value Ref Range   Glucose-Capillary 143 (H) 70 - 99 mg/dL    Comment: Glucose reference range applies only to samples  taken after fasting for at least 8 hours.  Glucose, capillary     Status: Abnormal   Collection Time: 03/26/20  9:18 PM  Result Value Ref Range   Glucose-Capillary 144 (H) 70 - 99 mg/dL    Comment: Glucose reference range applies only to samples taken after fasting for at least 8 hours.  Glucose, capillary     Status: Abnormal   Collection Time: 03/27/20  7:04 AM  Result Value Ref Range   Glucose-Capillary 184 (H) 70 - 99 mg/dL    Comment: Glucose reference range applies only to samples taken after fasting for at least 8 hours.  Glucose, capillary     Status: Abnormal   Collection Time: 03/27/20 11:26 AM  Result Value Ref Range   Glucose-Capillary 138 (H) 70 - 99 mg/dL    Comment: Glucose reference range  applies only to samples taken after fasting for at least 8 hours.   Comment 1 Notify RN    Comment 2 Document in Chart     Blood Alcohol level:  Lab Results  Component Value Date   ETH <10 01/16/2020   ETH <5 08/02/2015    Metabolic Disorder Labs: Lab Results  Component Value Date   HGBA1C 6.2 (H) 03/26/2020   MPG 131.24 03/26/2020   MPG 103 11/16/2010   No results found for: PROLACTIN Lab Results  Component Value Date   CHOL 202 (H) 03/26/2020   TRIG 292 (H) 03/26/2020   HDL 28 (L) 03/26/2020   CHOLHDL 7.2 03/26/2020   VLDL 58 (H) 03/26/2020   LDLCALC 116 (H) 03/26/2020   LDLCALC  11/17/2010    80        Total Cholesterol/HDL:CHD Risk Coronary Heart Disease Risk Table                     Men   Women  1/2 Average Risk   3.4   3.3  Average Risk       5.0   4.4  2 X Average Risk   9.6   7.1  3 X Average Risk  23.4   11.0        Use the calculated Patient Ratio above and the CHD Risk Table to determine the patient's CHD Risk.        ATP III CLASSIFICATION (LDL):  <100     mg/dL   Optimal  161-096100-129  mg/dL   Near or Above                    Optimal  130-159  mg/dL   Borderline  [id number]  mg/dL   High  >811>190     mg/dL   Very High    Physical Findings: AIMS:  Facial and Oral Movements Muscles of Facial Expression: None, normal Lips and Perioral Area: None, normal Jaw: None, normal Tongue: None, normal,Extremity Movements Upper (arms, wrists, hands, fingers): None, normal Lower (legs, knees, ankles, toes): None, normal, Trunk Movements Neck, shoulders, hips: None, normal, Overall Severity Severity of abnormal movements (highest score from questions above): None, normal Incapacitation due to abnormal movements: None, normal Patient's awareness of abnormal movements (rate only patient's report): No Awareness, Dental Status Current problems with teeth and/or dentures?: No Does patient usually wear dentures?: No  CIWA:  CIWA-Ar Total: 8 COWS:  COWS Total Score: 2  Musculoskeletal: Strength & Muscle Tone: normal   Gait & Station:  Waddling --due to obesity  Patient leans: n/a  Psychiatric Specialty Exam: Physical Exam  Review of Systems  Blood pressure (!) 115/49, pulse (!) 107, temperature 99 F (37.2 C), temperature source Oral, resp. rate 18, height 5\' 9"  (1.753 m), weight (!) 186.4 kg, SpO2 100 %.Body mass index is 60.69 kg/m.    Mental Status  Alert cooperative --oriented to person place and time  However she is volatile and labile -- Obese and needs surgery consult Rapport poor, intrusive but better today Exaggerates crying but could have rapid cycling also  Thought content and process --angry and paranoid but somewhat better Mood depressed, elevated angry Affect strange labile  No movement problems  Rash seems to be worsening ---will ask for dermatology clinic or get referral soon  Judgment insight reliability --poor SI and HI --none actually for now  However still needs time for balance Fund of knowledge and intelligence --not fully known  Concentration  and attention better Consciousness not clouded or fluctuant Speech still somewhat loud and pressured but decreasing with time Memory remote recent and immediate okay                                                ADL's:  Managing in general   Cognition:  Impaired when stressed   Sleep:  Number of Hours: 7.75     Treatment Plan Summary: Continues on unit until stable, could benefit from other meds but she is too sensitive to this for now   Haldol modified to 15 qhs as she is saying she feels it is too strong in mid morning    She needs to address rash soon  Roselind Messier, MD 03/27/2020, 12:15 PM

## 2020-03-27 NOTE — Progress Notes (Signed)
Patient had incontinent episode this morning.  She is currently crying because staff insists that she be responsible for cleaning up her urine.  She does not like this decision and instead would prefer that staff cleans up for her.

## 2020-03-27 NOTE — Plan of Care (Signed)
D- Patient alert and oriented. Patient presents in a labile mood on assessment, she can be peaceful and pleasant one minute, and the next, she's crying about not getting her way. Patient also stated that she wants to go home or back  to the "hosiptal in [facility] Patient reports that she slept ok last night and had complaints of stomach pain, however, she did not request any PRN medication from this writer, stating "it's not that bad". Patient endorsed both depression and anxiety, crying stating "I just want to go home". Patient denies SI, HI, AVH at this time. Patient had no stated goals for today.  A- Scheduled medications administered to patient, per MD orders. Support and encouragement provided.  Routine safety checks conducted every 15 minutes.  Patient informed to notify staff with problems or concerns.  R- No adverse drug reactions noted. Patient contracts for safety at this time. Patient compliant with medications and treatment plan. Patient receptive, calm, and cooperative after having a few outbursts of crying spells. Patient remains safe at this time.  Problem: Education: Goal: Utilization of techniques to improve thought processes will improve Outcome: Not Progressing Goal: Knowledge of the prescribed therapeutic regimen will improve Outcome: Not Progressing   Problem: Activity: Goal: Interest or engagement in leisure activities will improve Outcome: Not Progressing Goal: Imbalance in normal sleep/wake cycle will improve Outcome: Not Progressing   Problem: Coping: Goal: Coping ability will improve Outcome: Not Progressing Goal: Will verbalize feelings Outcome: Not Progressing   Problem: Health Behavior/Discharge Planning: Goal: Ability to make decisions will improve Outcome: Not Progressing Goal: Compliance with therapeutic regimen will improve Outcome: Not Progressing   Problem: Role Relationship: Goal: Will demonstrate positive changes in social behaviors and  relationships Outcome: Not Progressing   Problem: Safety: Goal: Ability to disclose and discuss suicidal ideas will improve Outcome: Not Progressing Goal: Ability to identify and utilize support systems that promote safety will improve Outcome: Not Progressing   Problem: Self-Concept: Goal: Will verbalize positive feelings about self Outcome: Not Progressing Goal: Level of anxiety will decrease Outcome: Not Progressing   Problem: Education: Goal: Ability to state activities that reduce stress will improve Outcome: Not Progressing   Problem: Coping: Goal: Ability to identify and develop effective coping behavior will improve Outcome: Not Progressing   Problem: Self-Concept: Goal: Ability to identify factors that promote anxiety will improve Outcome: Not Progressing Goal: Level of anxiety will decrease Outcome: Not Progressing Goal: Ability to modify response to factors that promote anxiety will improve Outcome: Not Progressing   Problem: Education: Goal: Knowledge of General Education information will improve Description: Including pain rating scale, medication(s)/side effects and non-pharmacologic comfort measures Outcome: Not Progressing   Problem: Health Behavior/Discharge Planning: Goal: Ability to manage health-related needs will improve Outcome: Not Progressing   Problem: Clinical Measurements: Goal: Ability to maintain clinical measurements within normal limits will improve Outcome: Not Progressing Goal: Will remain free from infection Outcome: Not Progressing Goal: Diagnostic test results will improve Outcome: Not Progressing Goal: Respiratory complications will improve Outcome: Not Progressing Goal: Cardiovascular complication will be avoided Outcome: Not Progressing   Problem: Activity: Goal: Risk for activity intolerance will decrease Outcome: Not Progressing   Problem: Nutrition: Goal: Adequate nutrition will be maintained Outcome: Not  Progressing   Problem: Coping: Goal: Level of anxiety will decrease Outcome: Not Progressing   Problem: Elimination: Goal: Will not experience complications related to bowel motility Outcome: Not Progressing Goal: Will not experience complications related to urinary retention Outcome: Not Progressing   Problem:  Pain Managment: Goal: General experience of comfort will improve Outcome: Not Progressing   Problem: Safety: Goal: Ability to remain free from injury will improve Outcome: Not Progressing   Problem: Skin Integrity: Goal: Risk for impaired skin integrity will decrease Outcome: Not Progressing

## 2020-03-27 NOTE — [facility] Group Notes (Signed)
[facility] Group Notes: (Clinical Social Work)   03/27/2020      Type of Therapy:  Group Therapy   Participation Level:  Did Not Attend despite MHT prompting Patient came into group room as group was ending asking to use telephone.   Shellia Cleverly, LCSW  03/27/2020 11:10 AM

## 2020-03-28 LAB — GLUCOSE, CAPILLARY
Glucose-Capillary: 164 mg/dL — ABNORMAL HIGH (ref 70–99)
Glucose-Capillary: 125 mg/dL — ABNORMAL HIGH (ref 70–99)

## 2020-03-28 MED ORDER — BENZTROPINE MESYLATE 0.5 MG PO TABS: 0.5000 mg | ORAL_TABLET | Freq: Two times a day (BID) | ORAL | 0 refills | Status: DC

## 2020-03-28 MED ORDER — TRAZODONE HCL 300 MG PO TABS: 300.0000 mg | ORAL_TABLET | Freq: Every day | ORAL | 0 refills | Status: DC

## 2020-03-28 MED ORDER — SIMVASTATIN 80 MG PO TABS: 80.0000 mg | ORAL_TABLET | Freq: Every day | ORAL | 0 refills | Status: DC

## 2020-03-28 MED ORDER — METFORMIN HCL 500 MG PO TABS: 500.0000 mg | ORAL_TABLET | Freq: Two times a day (BID) | ORAL | 0 refills | Status: DC

## 2020-03-28 MED ORDER — LISINOPRIL 20 MG PO TABS: 20.0000 mg | ORAL_TABLET | Freq: Every day | ORAL | 0 refills | Status: DC

## 2020-03-28 MED ORDER — HALOPERIDOL 10 MG PO TABS: 10.0000 mg | ORAL_TABLET | Freq: Three times a day (TID) | ORAL | 0 refills | Status: DC

## 2020-03-28 MED ORDER — PANTOPRAZOLE SODIUM 40 MG PO TBEC: 40.0000 mg | DELAYED_RELEASE_TABLET | Freq: Every day | ORAL | 0 refills | Status: DC

## 2020-03-28 NOTE — Progress Notes (Signed)
Patient ID: Jessica Rios, female   DOB: 06-22-1986, 34 y.o.   MRN: [id number]   Discharge Note:  Patient denies SI/HI/AVH at this time. Discharge instructions, AVS, prescriptions, home medications and transition record gone over with patient. Patient agrees to comply with medication management, follow-up visit, and outpatient therapy. Patient belongings returned to patient. Patient questions and concerns addressed and answered. Patient ambulatory off unit. Patient discharged to home with parents.

## 2020-03-28 NOTE — Progress Notes (Signed)
D- Patient alert and oriented. Patient presents in a labile, but pleasant mood at times, stating that she slept ok last night and had no complaints to voice to this Clinical research associate. Patient endorsed both depression and anxiety stating "everybody keeps yelling at me telling me to get out of bed". Patient denies SI, HI, AVH, and pain at this time. Patient's goal for today is to "follow rules and then to Doctors".  A- Scheduled medications administered to patient, per MD orders. Support and encouragement provided.  Routine safety checks conducted every 15 minutes.  Patient informed to notify staff with problems or concerns.  R- No adverse drug reactions noted. Patient contracts for safety at this time. Patient compliant with medications and treatment plan. Patient receptive, calm, and cooperative. Patient interacts well with others on the unit.  Patient remains safe at this time.

## 2020-03-28 NOTE — BHH Counselor (Signed)
CSW left confidential vm encouraging scheduler to call writer back to schedule hospital follow up appointment for the patient who sees Barbette Merino at [facility] [phone], awaiting response.

## 2020-03-28 NOTE — Progress Notes (Signed)
Recreation Therapy Notes  Date: 03/28/2020  Time: 9:30 am  Location: Craft room   Behavioral response: Appropriate  Intervention Topic: Coping Skills     Discussion/Intervention:  Group content on today was focused on coping skills. The group defined what coping skills are and when they can be used. Individuals described how they normally cope with thing and the coping skills they normally use. Patients expressed why it is important to cope with things and how not coping with things can affect you. The group participated in the intervention "My coping box" and made coping boxes while adding coping skills they could use in the future to the box. Clinical Observations/Feedback:  Patient came to group and identified her coping skill as taking her medication. She expressed that her coping skills come from her nurse aid.Individual was social with peers and staff while participating in the intervention during group.  Edgardo Petrenko LRT/CTRS         Evella Kasal 03/28/2020 12:50 PM

## 2020-03-28 NOTE — Progress Notes (Signed)
  [facility] Adult Case Management Discharge Plan :  Will you be returning to the same living situation after discharge:  Yes,  lives with spouse At discharge, do you have transportation home?: Yes,  pt reports parent will pick up Do you have the ability to pay for your medications: Yes,  Medicare  Release of information consent forms completed and in the chart;  Patient's signature needed at discharge.  Patient to Follow up at:  Follow-up Information    [facility] Follow up on 04/06/2020.   Why: You are scheduled to meet with Barbette Merino on Wednesday, July 7th at [facility]. Thank you. Contact information: [address],  [place], [place] [address] Ph:[phone] Fax:[phone]               Next level of care provider has access to [facility] Link:no  Safety Planning and Suicide Prevention discussed: Yes,  with pt and pt mother  Have you used any form of tobacco in the last 30 days? (Cigarettes, Smokeless Tobacco, Cigars, and/or Pipes): Patient Refused Screening  Has patient been referred to the Quitline?: Patient refused referral  Patient has been referred for addiction treatment: Pt. refused referral  Suzan Slick, LCSW 03/28/2020, 11:52 AM

## 2020-03-28 NOTE — Discharge Summary (Signed)
Physician Discharge Summary Note  Patient:  Jessica Rios is an 34 y.o., female MRN:  [id number] DOB:  [birth year] Patient phone:  [phone] (home)  Patient address:   [address] [place] [address],  Total Time spent with patient: 30 minutes  Date of Admission:  03/25/2020 Date of Discharge: 03/28/2020  Reason for Admission: Transferred to [place] from [facility] where she had presented with anxiety and suicidal ideation  Principal Problem: Bipolar 1 disorder [facility]) Discharge Diagnoses: Principal Problem:   Bipolar 1 disorder (HCC) Active Problems:   Diabetes (HCC)   Essential hypertension   Obesity   Rash and nonspecific skin eruption   Past Psychiatric History: Long history of chronic problems with mood instability and anxiety carrying diagnoses of borderline personality disorder bipolar disorder and depression.  Multiple prior suicide attempts and episodes of self injury  Past Medical History:  Past Medical History:  Diagnosis Date  . Bipolar affective (HCC)   . Diabetes mellitus   . High cholesterol   . Hypertension   . Sleep apnea     Past Surgical History:  Procedure Laterality Date  . TONSILLECTOMY     Family History: History reviewed. No pertinent family history. Family Psychiatric  History: See previous Social History:  Social History   Substance and Sexual Activity  Alcohol Use No     Social History   Substance and Sexual Activity  Drug Use No    Social History   Socioeconomic History  . Marital status: Legally Separated    Spouse name: Not on file  . Number of children: Not on file  . Years of education: Not on file  . Highest education level: Not on file  Occupational History  . Not on file  Tobacco Use  . Smoking status: Current Every Day Smoker    Packs/day: 1.00    Types: Cigarettes  . Smokeless tobacco: Never Used  Substance and Sexual Activity  . Alcohol use: No  . Drug use: No  . Sexual activity: Not Currently  Other  Topics Concern  . Not on file  Social History Narrative  . Not on file   Social Determinants of Health   Financial Resource Strain:   . Difficulty of Paying Living Expenses:   Food Insecurity:   . Worried About Programme researcher, broadcasting/film/video in the Last Year:   . Barista in the Last Year:   Transportation Needs:   . Freight forwarder (Medical):   Marland Kitchen Lack of Transportation (Non-Medical):   Physical Activity:   . Days of Exercise per Week:   . Minutes of Exercise per Session:   Stress:   . Feeling of Stress :   Social Connections:   . Frequency of Communication with Friends and Family:   . Frequency of Social Gatherings with Friends and Family:   . Attends Religious Services:   . Active Member of Clubs or Organizations:   . Attends Banker Meetings:   Marland Kitchen Marital Status:     Hospital Course: Admitted to psychiatric unit.  15-minute checks maintained.  Psychiatrically she was offered individual and group therapy assessment and supportive therapy.  Medications were essentially left unchanged from what it been helpful for her outside the hospital.  Patient did not display any dangerous behavior in the hospital and did not display any overt psychosis.  At the time of discharge she is calm and lucid and stating she feels normal and looks forward to getting back to  outpatient treatment.  Denies any suicidal thought.  From a medical standpoint her rash remains unchanged from what it appears to been on admission and how it seems to have been for the last several weeks.  Blood sugars have been maintained in the mid 100s.  Patient is counseled to make sure she gets appropriate outpatient medical follow-up  Physical Findings: AIMS: Facial and Oral Movements Muscles of Facial Expression: None, normal Lips and Perioral Area: None, normal Jaw: None, normal Tongue: None, normal,Extremity Movements Upper (arms, wrists, hands, fingers): None, normal Lower (legs, knees, ankles, toes):  None, normal, Trunk Movements Neck, shoulders, hips: None, normal, Overall Severity Severity of abnormal movements (highest score from questions above): None, normal Incapacitation due to abnormal movements: None, normal Patient's awareness of abnormal movements (rate only patient's report): No Awareness, Dental Status Current problems with teeth and/or dentures?: No Does patient usually wear dentures?: No  CIWA:  CIWA-Ar Total: 8 COWS:  COWS Total Score: 2  Musculoskeletal: Strength & Muscle Tone: within normal limits Gait & Station: normal Patient leans: N/A  Psychiatric Specialty Exam: Physical Exam Vitals and nursing note reviewed.  Constitutional:      Appearance: She is well-developed.  HENT:     Head: Normocephalic and atraumatic.  Eyes:     Conjunctiva/sclera: Conjunctivae normal.     Pupils: Pupils are equal, round, and reactive to light.  Cardiovascular:     Heart sounds: Normal heart sounds.  Pulmonary:     Effort: Pulmonary effort is normal.  Abdominal:     Palpations: Abdomen is soft.  Musculoskeletal:        General: Normal range of motion.     Cervical back: Normal range of motion.  Skin:    General: Skin is warm and dry.  Neurological:     General: No focal deficit present.     Mental Status: She is alert.  Psychiatric:        Mood and Affect: Mood normal.     Review of Systems  Constitutional: Negative.   HENT: Negative.   Eyes: Negative.   Respiratory: Negative.   Cardiovascular: Negative.   Gastrointestinal: Negative.   Musculoskeletal: Negative.   Skin: Negative.   Neurological: Negative.   Psychiatric/Behavioral: Negative.     Blood pressure (!) 143/64, pulse 98, temperature 98.1 F (36.7 C), temperature source Oral, resp. rate 18, height 5\' 9"  (1.753 m), weight (!) 186.4 kg, SpO2 97 %.Body mass index is 60.69 kg/m.  General Appearance: Casual  Eye Contact:  Good  Speech:  Clear and Coherent  Volume:  Normal  Mood:  Euthymic  Affect:   Appropriate  Thought Process:  Goal Directed  Orientation:  Full (Time, Place, and Person)  Thought Content:  Logical  Suicidal Thoughts:  No  Homicidal Thoughts:  No  Memory:  Immediate;   Fair Recent;   Fair Remote;   Fair  Judgement:  Fair  Insight:  Fair  Psychomotor Activity:  Normal  Concentration:  Concentration: Fair  Recall:  of Knowledge:  Fair  Language:  Fair  Akathisia:  No  Handed:  Right  AIMS (if indicated):     Assets:  Desire for Improvement Housing Resilience Social Support  ADL's:  Intact  Cognition:  WNL  Sleep:  Number of Hours: 7     Have you used any form of tobacco in the last 30 days? (Cigarettes, Smokeless Tobacco, Cigars, and/or Pipes): Patient Refused Screening  Has this patient used any form of tobacco in  the last 30 days? (Cigarettes, Smokeless Tobacco, Cigars, and/or Pipes) Yes, No  Blood Alcohol level:  Lab Results  Component Value Date   ETH <10 01/16/2020   ETH <5 08/02/2015    Metabolic Disorder Labs:  Lab Results  Component Value Date   HGBA1C 6.2 (H) 03/26/2020   MPG 131.24 03/26/2020   MPG 103 11/16/2010   Lab Results  Component Value Date   PROLACTIN 107.0 (H) 03/26/2020   Lab Results  Component Value Date   CHOL 202 (H) 03/26/2020   TRIG 292 (H) 03/26/2020   HDL 28 (L) 03/26/2020   CHOLHDL 7.2 03/26/2020   VLDL 58 (H) 03/26/2020   LDLCALC 116 (H) 03/26/2020   LDLCALC  11/17/2010    80        Total Cholesterol/HDL:CHD Risk Coronary Heart Disease Risk Table                     Men   Women  1/2 Average Risk   3.4   3.3  Average Risk       5.0   4.4  2 X Average Risk   9.6   7.1  3 X Average Risk  23.4   11.0        Use the calculated Patient Ratio above and the CHD Risk Table to determine the patient's CHD Risk.        ATP III CLASSIFICATION (LDL):  <100     mg/dL   Optimal  425-956  mg/dL   Near or Above                    Optimal  130-159  mg/dL   Borderline  387-564  mg/dL   High  >332      mg/dL   Very High    See Psychiatric Specialty Exam and Suicide Risk Assessment completed by Attending Physician prior to discharge.  Discharge destination:  Home  Is patient on multiple antipsychotic therapies at discharge:  No   Has Patient had three or more failed trials of antipsychotic monotherapy by history:  No  Recommended Plan for Multiple Antipsychotic Therapies: NA  Discharge Instructions    Diet - low sodium heart healthy   Complete by: As directed    Increase activity slowly   Complete by: As directed      Allergies as of 03/28/2020      Reactions   Effexor [venlafaxine Hydrochloride] Anxiety   Geodon [ziprasidone Hydrochloride] Rash   Tegretol [carbamazepine] Anxiety      Medication List    STOP taking these medications   amoxicillin-clavulanate 875-125 MG tablet Commonly known as: AUGMENTIN   cloNIDine 0.1 MG tablet Commonly known as: CATAPRES   diphenhydrAMINE 25 mg capsule Commonly known as: BENADRYL   HYDROcodone-acetaminophen 5-325 MG tablet Commonly known as: NORCO/VICODIN   lithium carbonate 450 MG CR tablet Commonly known as: ESKALITH   LORazepam 2 MG tablet Commonly known as: ATIVAN     TAKE these medications     Indication  benztropine 0.5 MG tablet Commonly known as: COGENTIN Take 1 tablet (0.5 mg total) by mouth 2 (two) times daily. What changed:   medication strength  how much to take  Indication: Extrapyramidal Reaction caused by Medications   haloperidol 10 MG tablet Commonly known as: HALDOL Take 1 tablet (10 mg total) by mouth 3 (three) times daily.  Indication: Problems with Behavior, MIXED BIPOLAR AFFECTIVE DISORDER   lisinopril 20 MG tablet Commonly known as: ZESTRIL  Take 1 tablet (20 mg total) by mouth daily. Start taking on: March 29, 2020  Indication: High Blood Pressure Disorder   metFORMIN 500 MG tablet Commonly known as: GLUCOPHAGE Take 1 tablet (500 mg total) by mouth 2 (two) times daily with a  meal. What changed: how much to take  Indication: Type 2 Diabetes   pantoprazole 40 MG tablet Commonly known as: PROTONIX Take 1 tablet (40 mg total) by mouth daily. Start taking on: March 29, 2020  Indication: Gastroesophageal Reflux Disease   simvastatin 80 MG tablet Commonly known as: ZOCOR Take 1 tablet (80 mg total) by mouth daily at 6 PM.  Indication: High Amount of Fats in the Blood   trazodone 300 MG tablet Commonly known as: DESYREL Take 1 tablet (300 mg total) by mouth at bedtime. What changed:   medication strength  how much to take  Indication: [place] Follow up.   Why: You are scheduled to meet with Chaya Jan on  Contact information: [address],  [place],  [address] [phone] Fax:[phone]               Follow-up recommendations:  Activity:  Activity as tolerated Diet:  Diabetic diet Other:  Follow-up with usual outpatient mental health and medical providers  Comments: Patient counseled to continue seeking medical help especially if rashes worsening or new symptoms develop  Signed: Alethia Berthold, MD 03/28/2020, 11:49 AM

## 2020-03-28 NOTE — Progress Notes (Cosign Needed)
Patient alert and oriented x 4 affect is blunted thoughts are organized and coherent no distress noted, she appears less anxious, less irritable she was appropriate this evening interacting appropriately with peers and staff no distress noted 15 minutes safety checks maintained will continue to monitor.

## 2020-03-28 NOTE — BHH Suicide Risk Assessment (Signed)
[facility] Discharge Suicide Risk Assessment   Principal Problem: Bipolar 1 disorder (HCC) Discharge Diagnoses: Principal Problem:   Bipolar 1 disorder (HCC) Active Problems:   Diabetes (HCC)   Essential hypertension   Obesity   Rash and nonspecific skin eruption   Total Time spent with patient: 30 minutes  Musculoskeletal: Strength & Muscle Tone: within normal limits Gait & Station: normal Patient leans: N/A  Psychiatric Specialty Exam: Review of Systems  Constitutional: Negative.   HENT: Negative.   Eyes: Negative.   Respiratory: Negative.   Cardiovascular: Negative.   Gastrointestinal: Negative.   Musculoskeletal: Negative.   Skin:       Extensive erythematous rash covering most of body including peeling skin on hands especially.  Unchanged from previously.  Neurological: Negative.   Psychiatric/Behavioral: Negative.     Blood pressure (!) 143/64, pulse 98, temperature 98.1 F (36.7 C), temperature source Oral, resp. rate 18, height 5\' 9"  (1.753 m), weight (!) 186.4 kg, SpO2 97 %.Body mass index is 60.69 kg/m.  General Appearance: Casual  Eye Contact::  Good  Speech:  Clear and Coherent409  Volume:  Normal  Mood:  Euthymic  Affect:  Congruent  Thought Process:  Goal Directed  Orientation:  Full (Time, Place, and Person)  Thought Content:  Logical  Suicidal Thoughts:  No  Homicidal Thoughts:  No  Memory:  Immediate;   Fair Recent;   Fair Remote;   Fair  Judgement:  Fair  Insight:  Fair  Psychomotor Activity:  Normal  Concentration:  Fair  Recall:  [id number] of Knowledge:Fair  Language: Fair  Akathisia:  No  Handed:  Right  AIMS (if indicated):     Assets:  Desire for Improvement Housing Resilience Social Support  Sleep:  Number of Hours: 7  Cognition: WNL  ADL's:  Intact   Mental Status Per Nursing Assessment::   On Admission:  Suicidal ideation indicated by patient, Self-harm thoughts, Thoughts of violence towards others  Demographic Factors:   Caucasian  Loss Factors: NA  Historical Factors: Impulsivity  Risk Reduction Factors:   Sense of responsibility to family, Living with another person, especially a relative, Positive social support, Positive therapeutic relationship and Positive coping skills or problem solving skills  Continued Clinical Symptoms:  Severe Anxiety and/or Agitation Depression:   Impulsivity  Cognitive Features That Contribute To Risk:  None    Suicide Risk:  Minimal: No identifiable suicidal ideation.  Patients presenting with no risk factors but with morbid ruminations; may be classified as minimal risk based on the severity of the depressive symptoms   Follow-up Information    [facility] Follow up.   Why: You are scheduled to meet with [facility] on  Contact information: [address]  [place], [place] Ph:[phone] Fax:[phone]               Plan Of Care/Follow-up recommendations:  Activity:  Activity as tolerated Diet:  Diabetic diet Other:  Follow-up with outpatient treatment with usual mental health provider  [phone], MD 03/28/2020, 11:43 AM

## 2020-04-24 ENCOUNTER — Other Ambulatory Visit: Payer: Self-pay | Admitting: Psychiatry

## 2020-05-06 ENCOUNTER — Other Ambulatory Visit: Payer: Self-pay | Admitting: Psychiatry

## 2020-05-18 ENCOUNTER — Other Ambulatory Visit: Payer: Self-pay | Admitting: Psychiatry

## 2020-05-26 ENCOUNTER — Other Ambulatory Visit: Payer: Self-pay | Admitting: Psychiatry

## 2020-07-21 ENCOUNTER — Emergency Department (HOSPITAL_COMMUNITY)
Admission: EM | Admit: 2020-07-21 | Discharge: 2020-07-22 | Disposition: A | Discharge status: Home / Self Care | Payer: Medicare Other | Attending: Emergency Medicine | Admitting: Emergency Medicine

## 2020-07-21 ENCOUNTER — Other Ambulatory Visit: Payer: Self-pay

## 2020-07-21 ENCOUNTER — Emergency Department (HOSPITAL_COMMUNITY): Payer: Medicare Other

## 2020-07-21 ENCOUNTER — Encounter (HOSPITAL_COMMUNITY): Payer: Self-pay | Admitting: Emergency Medicine

## 2020-07-21 DIAGNOSIS — R45851 Suicidal ideations: Secondary | ICD-10-CM | POA: Insufficient documentation

## 2020-07-21 DIAGNOSIS — F3130 Bipolar disorder, current episode depressed, mild or moderate severity, unspecified: Secondary | ICD-10-CM | POA: Diagnosis present

## 2020-07-21 DIAGNOSIS — Z20822 Contact with and (suspected) exposure to covid-19: Secondary | ICD-10-CM | POA: Insufficient documentation

## 2020-07-21 DIAGNOSIS — F17210 Nicotine dependence, cigarettes, uncomplicated: Secondary | ICD-10-CM | POA: Insufficient documentation

## 2020-07-21 DIAGNOSIS — Z7984 Long term (current) use of oral hypoglycemic drugs: Secondary | ICD-10-CM | POA: Insufficient documentation

## 2020-07-21 DIAGNOSIS — E119 Type 2 diabetes mellitus without complications: Secondary | ICD-10-CM | POA: Diagnosis not present

## 2020-07-21 DIAGNOSIS — Z79899 Other long term (current) drug therapy: Secondary | ICD-10-CM | POA: Diagnosis not present

## 2020-07-21 DIAGNOSIS — F319 Bipolar disorder, unspecified: Secondary | ICD-10-CM | POA: Diagnosis present

## 2020-07-21 DIAGNOSIS — F315 Bipolar disorder, current episode depressed, severe, with psychotic features: Secondary | ICD-10-CM | POA: Diagnosis not present

## 2020-07-21 DIAGNOSIS — R443 Hallucinations, unspecified: Secondary | ICD-10-CM

## 2020-07-21 DIAGNOSIS — I10 Essential (primary) hypertension: Secondary | ICD-10-CM | POA: Diagnosis not present

## 2020-07-21 LAB — CBC WITH DIFFERENTIAL/PLATELET
Abs Immature Granulocytes: 0.04 10*3/uL (ref 0.00–0.07)
Basophils Absolute: 0.1 10*3/uL (ref 0.0–0.1)
Basophils Relative: 0 %
Eosinophils Absolute: 0.2 10*3/uL (ref 0.0–0.5)
Eosinophils Relative: 2 %
HCT: 39 % (ref 36.0–46.0)
Hemoglobin: 12.5 g/dL (ref 12.0–15.0)
Immature Granulocytes: 0 %
Lymphocytes Relative: 42 %
Lymphs Abs: 6 10*3/uL — ABNORMAL HIGH (ref 0.7–4.0)
MCH: 31.1 pg (ref 26.0–34.0)
MCHC: 32.1 g/dL (ref 30.0–36.0)
MCV: 97 fL (ref 80.0–100.0)
Monocytes Absolute: 1 10*3/uL (ref 0.1–1.0)
Monocytes Relative: 7 %
Neutro Abs: 7 10*3/uL (ref 1.7–7.7)
Neutrophils Relative %: 49 %
Platelets: 234 10*3/uL (ref 150–400)
RBC: 4.02 MIL/uL (ref 3.87–5.11)
RDW: 14.3 % (ref 11.5–15.5)
WBC: 14.3 10*3/uL — ABNORMAL HIGH (ref 4.0–10.5)
nRBC: 0 % (ref 0.0–0.2)

## 2020-07-21 LAB — COMPREHENSIVE METABOLIC PANEL
ALT: 23 U/L (ref 0–44)
AST: 16 U/L (ref 15–41)
Albumin: 4.1 g/dL (ref 3.5–5.0)
Alkaline Phosphatase: 42 U/L (ref 38–126)
Anion gap: 14 (ref 5–15)
BUN: 22 mg/dL — ABNORMAL HIGH (ref 6–20)
CO2: 25 mmol/L (ref 22–32)
Calcium: 10 mg/dL (ref 8.9–10.3)
Chloride: 102 mmol/L (ref 98–111)
Creatinine, Ser: 1.49 mg/dL — ABNORMAL HIGH (ref 0.44–1.00)
GFR, Estimated: 46 mL/min — ABNORMAL LOW (ref >60)
Glucose, Bld: 125 mg/dL — ABNORMAL HIGH (ref 70–99)
Potassium: 3.6 mmol/L (ref 3.5–5.1)
Sodium: 141 mmol/L (ref 135–145)
Total Bilirubin: 0.9 mg/dL (ref 0.3–1.2)
Total Protein: 6.9 g/dL (ref 6.5–8.1)

## 2020-07-21 LAB — RAPID URINE DRUG SCREEN, HOSP PERFORMED
Amphetamines: NOT DETECTED
Barbiturates: NOT DETECTED
Benzodiazepines: POSITIVE — AB
Cocaine: NOT DETECTED
Opiates: NOT DETECTED
Tetrahydrocannabinol: NOT DETECTED

## 2020-07-21 LAB — URINALYSIS, ROUTINE W REFLEX MICROSCOPIC
Bacteria, UA: NONE SEEN
Glucose, UA: >=500 mg/dL — AB
Hgb urine dipstick: NEGATIVE
Ketones, ur: 5 mg/dL — AB
Nitrite: NEGATIVE
Protein, ur: 100 mg/dL — AB
Specific Gravity, Urine: 1.024 (ref 1.005–1.030)
pH: 5 (ref 5.0–8.0)

## 2020-07-21 LAB — ETHANOL: Alcohol, Ethyl (B): <10 mg/dL (ref <10)

## 2020-07-21 MED ORDER — LISINOPRIL 20 MG PO TABS
20.0000 mg | ORAL_TABLET | Freq: Every day | ORAL | Status: DC
  Filled 2020-07-21: qty 1

## 2020-07-21 MED ORDER — METFORMIN HCL ER 500 MG PO TB24
500.0000 mg | ORAL_TABLET | Freq: Every day | ORAL | Status: DC
  Administered 2020-07-22: 500 mg via ORAL
  Filled 2020-07-21 (×3): qty 1

## 2020-07-21 MED ORDER — DAPAGLIFLOZIN PROPANEDIOL 5 MG PO TABS
5.0000 mg | ORAL_TABLET | Freq: Every day | ORAL | Status: DC
  Administered 2020-07-22: 5 mg via ORAL
  Filled 2020-07-21: qty 1

## 2020-07-21 MED ORDER — ACETAMINOPHEN 325 MG PO TABS
650.0000 mg | ORAL_TABLET | ORAL | Status: DC | PRN
  Administered 2020-07-22: 650 mg via ORAL
  Filled 2020-07-21: qty 2

## 2020-07-21 NOTE — ED Provider Notes (Signed)
[facility]-EMERGENCY DEPT Provider Note   CSN: [id number] Arrival date & time: 07/21/20  2031     History Chief Complaint  Patient presents with  . Hallucinations  . Suicidal    Jessica Rios is a 34 y.o. female.  HPI   Patient with significant medical history of bipolar, diabetes, hypertension, depression presents to the emergency department with chief complaint of hallucinations and suicidal ideations.  Patient states she has been seeing "dragons and talking to dogs".  She also endorses that she has thoughts of hurting herself.  She has no active plan and currently denies homicidal ideations.  Patient states she has been off her bipolar medications and feels like this could have sparked her decline.  She endorses that she has tried to kill herself in the past by overdosing or cutting herself.  Patient also explains that she has some right hip pain which started yesterday.  Patient states she was outside  and fell on her hip.  She states since then she has some pain especially when she presses on her hip.  She denies paresthesias or weakness in her lower extremities.  Denies urinary retention, urine incontinency, or difficult bowel movements.  Patient not taking any pain medication for her hip pain.  Patient denies hitting her head, losing conscious, is not on anticoags.  Patient states she is here because she wants mental help.  Patient denies headache, fever, chills, shortness of breath, chest pain, dumping, nausea, vomiting, diarrhea, pedal edema.  Past Medical History:  Diagnosis Date  . Bipolar affective (HCC)   . Diabetes mellitus   . High cholesterol   . Hypertension   . Sleep apnea     Patient Active Problem List   Diagnosis Date Noted  . Bipolar 1 disorder (HCC) 03/25/2020  . Diabetes (HCC) 03/25/2020  . Essential hypertension 03/25/2020  . Obesity 03/25/2020  . Rash and nonspecific skin eruption 03/25/2020    Past Surgical History:  Procedure  Laterality Date  . TONSILLECTOMY       OB History   No obstetric history on file.     History reviewed. No pertinent family history.  Social History   Tobacco Use  . Smoking status: Current Every Day Smoker    Packs/day: 1.00    Types: Cigarettes  . Smokeless tobacco: Never Used  Substance Use Topics  . Alcohol use: No  . Drug use: No    Home Medications Prior to Admission medications   Medication Sig Start Date End Date Taking? Authorizing Provider  dapagliflozin propanediol (FARXIGA) 5 MG TABS tablet Take 5 mg by mouth daily.   Yes [provider]  haloperidol (HALDOL) 10 MG tablet Take 1 tablet (10 mg total) by mouth 3 (three) times daily. Patient taking differently: Take 10 mg by mouth 2 (two) times daily.  03/28/20  Yes Clapacs, Jackquline Denmark, MD  levocetirizine (XYZAL) 5 MG tablet Take 5 mg by mouth daily.   Yes [provider]  lisinopril (ZESTRIL) 20 MG tablet Take 1 tablet (20 mg total) by mouth daily. 03/29/20  Yes Clapacs, Jackquline Denmark, MD  LORazepam (ATIVAN) 0.5 MG tablet Take 0.5 mg by mouth every 8 (eight) hours as needed for anxiety.   Yes [provider]  metFORMIN (GLUCOPHAGE-XR) 500 MG 24 hr tablet Take 500 mg by mouth in the morning and at bedtime.   Yes [provider]  Oxcarbazepine (TRILEPTAL) 300 MG tablet Take 300 mg by mouth 2 (two) times daily.   Yes [provider]  simvastatin (ZOCOR) 80 MG tablet Take 1 tablet (80 mg total) by mouth daily at 6 PM. 03/28/20  Yes Clapacs, Jackquline Denmark, MD  sitaGLIPtin (JANUVIA) 100 MG tablet Take 100 mg by mouth daily.   Yes [provider]  traZODone (DESYREL) 150 MG tablet Take 375 mg by mouth at bedtime. (2.5 Tablets)   Yes [provider]  valACYclovir (VALTREX) 500 MG tablet Take 500 mg by mouth daily.   Yes [provider]  benztropine (COGENTIN) 0.5 MG tablet Take 1 tablet (0.5 mg total) by mouth 2 (two) times daily. Patient not taking: Reported on 07/21/2020  03/28/20   Clapacs, Jackquline Denmark, MD  metFORMIN (GLUCOPHAGE) 500 MG tablet Take 1 tablet (500 mg total) by mouth 2 (two) times daily with a meal. Patient not taking: Reported on 07/21/2020 03/28/20   Clapacs, Jackquline Denmark, MD  pantoprazole (PROTONIX) 40 MG tablet Take 1 tablet (40 mg total) by mouth daily. Patient not taking: Reported on 07/21/2020 03/29/20   Clapacs, Jackquline Denmark, MD  traZODone (DESYREL) 300 MG tablet Take 1 tablet (300 mg total) by mouth at bedtime. Patient not taking: Reported on 07/21/2020 03/28/20   Clapacs, Jackquline Denmark, MD    Allergies    Bupropion, Other, Effexor [venlafaxine hydrochloride], Geodon [ziprasidone hydrochloride], and Tegretol [carbamazepine]  Review of Systems   Review of Systems  Constitutional: Negative for chills and fever.  HENT: Negative for congestion, tinnitus, trouble swallowing and voice change.   Eyes: Negative for visual disturbance.  Respiratory: Negative for shortness of breath.   Cardiovascular: Negative for chest pain.  Gastrointestinal: Negative for abdominal pain, diarrhea, nausea and vomiting.  Genitourinary: Negative for enuresis, flank pain and frequency.  Musculoskeletal: Negative for back pain.       Right hip pain.  Skin: Negative for rash.  Neurological: Negative for dizziness.  Hematological: Does not bruise/bleed easily.    Physical Exam Updated Vital Signs BP 110/60 (BP Location: Right Arm)   Pulse 86   Temp 98.1 F (36.7 C) (Oral)   Resp 18   SpO2 94%   Physical Exam Vitals and nursing note reviewed.  Constitutional:      General: She is not in acute distress.    Appearance: She is not ill-appearing.  HENT:     Head: Normocephalic and atraumatic.     Nose: No congestion.     Mouth/Throat:     Mouth: Mucous membranes are moist.     Pharynx: Oropharynx is clear.  Eyes:     General: No scleral icterus. Cardiovascular:     Rate and Rhythm: Normal rate and regular rhythm.     Pulses: Normal pulses.     Heart sounds: No murmur  heard.  No friction rub. No gallop.   Pulmonary:     Effort: No respiratory distress.     Breath sounds: No wheezing, rhonchi or rales.  Abdominal:     General: There is no distension.     Palpations: Abdomen is soft.     Tenderness: There is no abdominal tenderness. There is no right CVA tenderness, left CVA tenderness or guarding.  Musculoskeletal:        General: Tenderness present. No swelling.     Right lower leg: No edema.     Left lower leg: No edema.     Comments: Patient had full range of motion as well as 5 5 strength and neurovascularly intact in all 4 extremities.  Patient had slight tenderness to palpation along her  right hip, no crepitus, or other gross deformities palpated.  Skin:    General: Skin is warm and dry.     Capillary Refill: Capillary refill takes less than 2 seconds.     Findings: No rash.  Neurological:     Mental Status: She is alert.  Psychiatric:        Mood and Affect: Mood normal.     ED Results / Procedures / Treatments   Labs (all labs ordered are listed, but only abnormal results are displayed) Labs Reviewed  COMPREHENSIVE METABOLIC PANEL - Abnormal; Notable for the following components:      Result Value   Glucose, Bld 125 (*)    BUN 22 (*)    Creatinine, Ser 1.49 (*)    GFR, Estimated 46 (*)    All other components within normal limits  RAPID URINE DRUG SCREEN, HOSP PERFORMED - Abnormal; Notable for the following components:   Benzodiazepines POSITIVE (*)    All other components within normal limits  CBC WITH DIFFERENTIAL/PLATELET - Abnormal; Notable for the following components:   WBC 14.3 (*)    Lymphs Abs 6.0 (*)    All other components within normal limits  URINALYSIS, ROUTINE W REFLEX MICROSCOPIC - Abnormal; Notable for the following components:   Color, Urine AMBER (*)    APPearance CLOUDY (*)    Glucose, UA >=500 (*)    Bilirubin Urine SMALL (*)    Ketones, ur 5 (*)    Protein, ur 100 (*)    Leukocytes,Ua SMALL (*)    All  other components within normal limits  RESPIRATORY PANEL BY RT PCR (FLU A&B, COVID)  ETHANOL  I-STAT BETA HCG BLOOD, ED ([facility], [facility], [facility] ONLY)    EKG None  Radiology DG Pelvis Portable  Result Date: 07/21/2020 CLINICAL DATA:  34 year old female right hip pain. EXAM: PORTABLE PELVIS 1-2 VIEWS COMPARISON:  [facility] Pelvis CT 07/16/2020. FINDINGS: Portable AP view at 2132 hours. Chronic fracture of the left pubic symphysis, and possibly also the junction of the left superior pubic ramus and acetabulum, again noted. Associated degenerative spurring along the left side of the pubic symphysis. Femoral heads are normally located. Proximal femurs appear grossly intact. No acute pelvis fracture is identified. SI joints appear within normal limits. Background bone mineralization is normal. IUD remains in place. Negative lower abdominal and pelvic visceral contours. IMPRESSION: Chronic fractures of the left pubic bones. No acute osseous abnormality identified. Electronically Signed   By: Odessa Fleming M.D.   On: 07/21/2020 21:41    Procedures Procedures (including critical care time)  Medications Ordered in ED Medications  dapagliflozin propanediol (FARXIGA) tablet 5 mg (has no administration in time range)  metFORMIN (GLUCOPHAGE-XR) 24 hr tablet 500 mg (has no administration in time range)  lisinopril (ZESTRIL) tablet 20 mg (has no administration in time range)  acetaminophen (TYLENOL) tablet 650 mg (has no administration in time range)    ED Course  I have reviewed the triage vital signs and the nursing notes.  Pertinent labs & imaging results that were available during my care of the patient were reviewed by me and considered in my medical decision making (see chart for details).    MDM Rules/Calculators/A&P                          I have personally reviewed all imaging, labs and have interpreted them.  Patient is here for suicidal ideations  and  hallucinations.  Patient was alert, did not appear in acute distress, vital signs reassuring.  Will order med clearance labs as well as x-ray of her right hip for further evaluation.  CBC positive for leukocytosis 14.3 slightly elevated above baseline.  No signs of anemia.  CMP negative for Electra abnormalities, no metabolic acidosis, hyperglycemia of 125, creatinine and bun slightly of elevated appears to be at baseline for patient.,  Ethanol less than 10, rapid drug screen positive for benzos.  UA shows 5 ketones, 100 protein, small leukocytes, shows red blood cells, white blood cells no bacteria present.  Will culture for further evaluation. X-ray reveals chronic fractures of the left pubic bone no acute abnormalities noted.  Low suspicion for cardiac abnormality or ACS as patient denies chest pain, shortness of breath, no signs of hypoperfusion fluid overload noted on exam.  Low suspicion for pneumonia or systemic infection as patient is nontoxic-appearing, vital signs reassuring, no obvious source of infection noted on exam, lung sounds are clear bilaterally.  Low suspicion for UTI or pyelonephritis as patient denies urinary symptoms, no CVA tenderness noted on exam, UA shows small leukocytes white blood cells white blood cells with epithelial cells.  Low suspicion for fracture or dislocation of the hip as x-ray does not show any acute abnormalities, no crepitus or gross deformities felt on exam.  Low suspicion for toxic ingestion as patient denies doing so, lab work does not reveal any significant findings. Low suspicion for intra-abdominal abnormality requiring surgical intervention as patient denies abdominal pain, tolerating p.o., no acute abdomen noted on exam.  I suspect patient's hallucinations are secondary to her noncompliance with her psychotic medications.  Will place patient in psych hold and order home meds.  Patient is currently not IVC it at this time she states she will stay for full  treatment.  Will await TTS recommendations.   Final Clinical Impression(s) / ED Diagnoses Final diagnoses:  Suicidal ideation  Hallucinations    Rx / DC Orders ED Discharge Orders    None       Carroll SageFaulkner, Zamoria Boss J, PA-C 07/21/20 2329    Melene PlanFloyd, Dan, DO 07/22/20 1617

## 2020-07-21 NOTE — ED Triage Notes (Signed)
Pt from home c/o hallucination seeing dragons in the sky also hearing dogs talk. Admit to smoking crack 1 day ago also states has been off her meds for 2 weeks was unable to obtain meds.  Superficial wound left forearm pt denies attempting to kill self when cutting self but admits to suicidal ideations.

## 2020-07-22 DIAGNOSIS — F315 Bipolar disorder, current episode depressed, severe, with psychotic features: Secondary | ICD-10-CM | POA: Diagnosis not present

## 2020-07-22 LAB — RESPIRATORY PANEL BY RT PCR (FLU A&B, COVID)
Influenza A by PCR: NEGATIVE
Influenza B by PCR: NEGATIVE
SARS Coronavirus 2 by RT PCR: NEGATIVE

## 2020-07-22 LAB — PREGNANCY, URINE: Preg Test, Ur: NEGATIVE

## 2020-07-22 MED ORDER — LINAGLIPTIN 5 MG PO TABS
5.0000 mg | ORAL_TABLET | Freq: Every day | ORAL | Status: DC
  Administered 2020-07-22: 5 mg via ORAL
  Filled 2020-07-22: qty 1

## 2020-07-22 MED ORDER — LORAZEPAM 2 MG/ML IJ SOLN: 1.0000 mg | Freq: Once | INTRAMUSCULAR | Status: DC

## 2020-07-22 MED ORDER — ZIPRASIDONE MESYLATE 20 MG IM SOLR: 20.0000 mg | Freq: Once | INTRAMUSCULAR | Status: DC

## 2020-07-22 MED ORDER — VALACYCLOVIR HCL 500 MG PO TABS
500.0000 mg | ORAL_TABLET | Freq: Every day | ORAL | Status: DC
  Administered 2020-07-22: 500 mg via ORAL
  Filled 2020-07-22: qty 1

## 2020-07-22 MED ORDER — TRAZODONE HCL 50 MG PO TABS: 375.0000 mg | ORAL_TABLET | Freq: Every day | ORAL | Status: DC

## 2020-07-22 NOTE — ED Notes (Signed)
Geodon and ativan charted on wrong pt and  was not given

## 2020-07-22 NOTE — Progress Notes (Signed)
TOC CM/CSW contacted [employer].  CSW faxed "[employer]'s Waiver" to [employer].  CSW informed ED TCU/Beth, RN that [facility] [employer] would be contacting her when they arrive.  CSW will continue to follow for dc needs.  Itzel Mckibbin Tarpley-Carter, MSW, LCSW-A Pronouns:  She, Her, Hers                  [facility] ED Transitions of CareClinical Social Worker [email or web address] [phone]

## 2020-07-22 NOTE — Social Work (Signed)
..   Transition of Care [facility]) - Emergency Department Mini Assessment   Patient Details  Name: Jessica Rios MRN: [id number] Date of Birth: Sep 27, 1986  [facility] CM/SW Contact:    Teague Goynes C Tarpley-Carter, LCSWA Phone Number: 07/22/2020, 3:03 PM   Clinical Narrative: TOC CM/CSW was asked to consult with pt in regards to transportation home.  CSW actively listened to pt speak about her separation from husband and her relationship with her mother.  Pt became upset, but with validation pt calmed down.  Pt proceeded to disclose to CSW that she did want to go home.    Pt signed "[employer]ider's Waiver".  CSW will continue to follow for dc needs.  Tomasita Beevers Tarpley-Carter, MSW, LCSW-A Pronouns:  She, Her, Hers                  [facility] ED Transitions of CareClinical Social Worker [email or web address] [phone]   ED Mini Assessment: What brought you to the Emergency Department? : Hallucinations and suicidal ideations  Barriers to Discharge: Transportation, No Barriers Identified  Barrier interventions: Provide pt with transportation home.     Interventions which prevented an admission or readmission: Transportation Screening    Patient Contact and Communications        ,          Patient states their goals for this hospitalization and ongoing recovery are:: To receive transportation home.      Admission diagnosis:  psych Patient Active Problem List   Diagnosis Date Noted  . Bipolar 1 disorder (HCC) 03/25/2020  . Diabetes (HCC) 03/25/2020  . Essential hypertension 03/25/2020  . Obesity 03/25/2020  . Rash and nonspecific skin eruption 03/25/2020   PCP:  Darryl Lent, PA-C Pharmacy:   [facility] [phone] [place], [place] - [address] [place] [address] Phone: [phone] Fax: [phone]  [facility] [address] [place], [place] - [address] [place] [address] Phone: [phone] Fax: [phone]

## 2020-07-22 NOTE — ED Notes (Signed)
Pt DC d off unit to home per provider. DC information and belongings given to pt. Pt ambulated off unit, escorted by RN. Pt given bus pass per charge nurse.

## 2020-07-22 NOTE — ED Provider Notes (Addendum)
Emergency Medicine Observation Re-evaluation Note  Jessica Rios is a 34 y.o. female, seen on rounds today.  Pt initially presented to the ED for complaints of Hallucinations and Suicidal Currently, the patient is awaiting psychiatric disposition.  Physical Exam  BP (!) 101/46   Pulse 75   Temp 98.2 F (36.8 C) (Oral)   Resp 18   SpO2 95%  Physical Exam General: Calm, cooperative, no acute distress Cardiac: Regular rhythm Lungs: Breathing easily, no stridor Psych: Calm, cooperative normal mood  ED Course / MDM  EKG:    I have reviewed the labs performed to date as well as medications administered while in observation.  Recent changes in the last 24 hours include no acute changes.  Plan  Current plan is for inpatient treatment. Patient is under full IVC at this time.   Jessica Dibbles, MD 07/22/20 [phone]   Patient has been cleared from a psychiatric standpoint.  She is ready for discharge   Jessica Dibbles, MD 07/22/20 1104
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
## 2020-07-22 NOTE — ED Notes (Addendum)
Per Waynetta Sandy, RN pt was provided with an [place] after d/c. Marchelle Folks, NT advised that pt was at ED entrance and was refused transport by the [place] driver since the pt was attempting to change the destination. Pt was found to be sitting on bench outside with NT Marchelle Folks and [employer] officer. Pt was observed to be crying and states that she wanted to go somewhere else. Pt was asked if a bus pass would get her where she wanted to go. She agreed. Pt was provided with bus pass and started yelling that it was not enough to get her where she wanted to go in [facility]. Per Marchelle Folks, NT pt removed her glasses, broke them and started to scratch her wrists. Pt was brought back into the waiting area with security and [employer] officer. Pt was asked what she wanted from [place] since she lost her [place] and the bus pass was not enough. She states that she wanted someone to call her mother or father to pick her up. Pt was taken to phone and made a phone call. Upon ending her call, pt became belligerent and starting cursing at [facility] and staff. Marchelle Folks, NT advised that SW had ordered another [employer] [employer] for patient. Pt remains with security and [employer] at ED entrance waiting for her [facility] to arrive. Pt was ambulatory w/o assistance and in no distress.

## 2020-07-22 NOTE — Progress Notes (Signed)
TOC CM/CSW received a call from [place], RN that pt had been asked to get out of [facility] and was left at the ED.    CSW will continue to follow for dc needs.  Edlyn Rosenburg Tarpley-Carter, MSW, LCSW-A Pronouns:  She, Her, Hers                  [facility] Transitions of CareClinical Social Worker [email or web address] [phone]
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
## 2020-07-22 NOTE — Progress Notes (Signed)
@  2:11pm  TOC CM/CSW was informed of the reason pt was asked to leave the vehicle.  Pt was asked to leave the vehicle because she want to change the destination.  Pt was told by CSW that [employer] was being arranged to take her home, pt had agreed to that prior to [employer] being called and pt signing waiver.  Druscilla Petsch Tarpley-Carter, MSW, LCSW-A Pronouns:  She, Her, Hers                  [facility] Transitions of CareClinical Social Worker [email or web address] [phone]

## 2020-07-22 NOTE — Progress Notes (Signed)
TOC CM/CSW contacted [employer] for an explanation of why pt was asked to get out of transportation.  CSW then followed up with asking [employer] to come back out to pick up pt.  [facility] [employer] honored CSW's request.  CSW will continue to follow for dc needs.  Kelven Flater Tarpley-Carter, MSW, LCSW-A Pronouns:  She, Her, Hers                  [facility] Transitions of CareClinical Social Worker [email or web address] [phone]

## 2020-07-22 NOTE — Discharge Instructions (Addendum)
Follow-up with the recommendations from the mental health team.  Return as needed for worsening symptoms

## 2020-08-16 ENCOUNTER — Emergency Department (HOSPITAL_COMMUNITY)
Admission: EM | Admit: 2020-08-16 | Discharge: 2020-08-17 | Disposition: A | Discharge status: Home / Self Care | Payer: Medicare Other | Attending: Emergency Medicine | Admitting: Emergency Medicine

## 2020-08-16 ENCOUNTER — Encounter (HOSPITAL_COMMUNITY): Payer: Self-pay | Admitting: Emergency Medicine

## 2020-08-16 DIAGNOSIS — E119 Type 2 diabetes mellitus without complications: Secondary | ICD-10-CM | POA: Insufficient documentation

## 2020-08-16 DIAGNOSIS — Z7984 Long term (current) use of oral hypoglycemic drugs: Secondary | ICD-10-CM | POA: Insufficient documentation

## 2020-08-16 DIAGNOSIS — I10 Essential (primary) hypertension: Secondary | ICD-10-CM | POA: Insufficient documentation

## 2020-08-16 DIAGNOSIS — Z20822 Contact with and (suspected) exposure to covid-19: Secondary | ICD-10-CM | POA: Diagnosis not present

## 2020-08-16 DIAGNOSIS — R456 Violent behavior: Secondary | ICD-10-CM | POA: Insufficient documentation

## 2020-08-16 DIAGNOSIS — F319 Bipolar disorder, unspecified: Secondary | ICD-10-CM | POA: Insufficient documentation

## 2020-08-16 DIAGNOSIS — R4689 Other symptoms and signs involving appearance and behavior: Secondary | ICD-10-CM

## 2020-08-16 DIAGNOSIS — Z79899 Other long term (current) drug therapy: Secondary | ICD-10-CM | POA: Diagnosis not present

## 2020-08-16 DIAGNOSIS — Z7289 Other problems related to lifestyle: Secondary | ICD-10-CM | POA: Diagnosis not present

## 2020-08-16 LAB — RESPIRATORY PANEL BY RT PCR (FLU A&B, COVID)
Influenza A by PCR: NEGATIVE
Influenza B by PCR: NEGATIVE
SARS Coronavirus 2 by RT PCR: NEGATIVE

## 2020-08-16 LAB — RAPID URINE DRUG SCREEN, HOSP PERFORMED
Amphetamines: NOT DETECTED
Barbiturates: NOT DETECTED
Benzodiazepines: POSITIVE — AB
Cocaine: NOT DETECTED
Opiates: NOT DETECTED
Tetrahydrocannabinol: NOT DETECTED

## 2020-08-16 LAB — CBG MONITORING, ED: Glucose-Capillary: 127 mg/dL — ABNORMAL HIGH (ref 70–99)

## 2020-08-16 MED ORDER — TRAZODONE HCL 50 MG PO TABS
375.0000 mg | ORAL_TABLET | Freq: Every day | ORAL | Status: DC
  Administered 2020-08-16: 375 mg via ORAL
  Filled 2020-08-16: qty 1.5

## 2020-08-16 MED ORDER — VALACYCLOVIR HCL 500 MG PO TABS
500.0000 mg | ORAL_TABLET | Freq: Every day | ORAL | Status: DC
  Administered 2020-08-17: 500 mg via ORAL
  Filled 2020-08-16: qty 1

## 2020-08-16 MED ORDER — ATORVASTATIN CALCIUM 40 MG PO TABS
40.0000 mg | ORAL_TABLET | Freq: Every day | ORAL | Status: DC
  Administered 2020-08-17: 40 mg via ORAL
  Filled 2020-08-16: qty 1

## 2020-08-16 MED ORDER — LORAZEPAM 2 MG/ML IJ SOLN
1.0000 mg | Freq: Once | INTRAMUSCULAR | Status: AC
  Administered 2020-08-16: 1 mg via INTRAMUSCULAR
  Filled 2020-08-16: qty 1

## 2020-08-16 MED ORDER — OXCARBAZEPINE 300 MG PO TABS
300.0000 mg | ORAL_TABLET | Freq: Two times a day (BID) | ORAL | Status: DC
  Administered 2020-08-16 – 2020-08-17 (×2): 300 mg via ORAL
  Filled 2020-08-16 (×2): qty 1

## 2020-08-16 MED ORDER — OLANZAPINE 10 MG IM SOLR
10.0000 mg | Freq: Once | INTRAMUSCULAR | Status: AC
  Administered 2020-08-16: 10 mg via INTRAMUSCULAR
  Filled 2020-08-16: qty 10

## 2020-08-16 MED ORDER — LINAGLIPTIN 5 MG PO TABS
5.0000 mg | ORAL_TABLET | Freq: Every day | ORAL | Status: DC
  Administered 2020-08-17: 5 mg via ORAL
  Filled 2020-08-16: qty 1

## 2020-08-16 MED ORDER — DIPHENHYDRAMINE HCL 50 MG/ML IJ SOLN
25.0000 mg | Freq: Once | INTRAMUSCULAR | Status: AC
  Administered 2020-08-16: 25 mg via INTRAMUSCULAR
  Filled 2020-08-16: qty 1

## 2020-08-16 MED ORDER — LISINOPRIL 20 MG PO TABS
20.0000 mg | ORAL_TABLET | Freq: Every day | ORAL | Status: DC
  Administered 2020-08-17: 20 mg via ORAL
  Filled 2020-08-16: qty 1

## 2020-08-16 MED ORDER — LORAZEPAM 0.5 MG PO TABS: 0.5000 mg | ORAL_TABLET | Freq: Three times a day (TID) | ORAL | Status: DC | PRN

## 2020-08-16 MED ORDER — METFORMIN HCL ER 500 MG PO TB24
500.0000 mg | ORAL_TABLET | Freq: Two times a day (BID) | ORAL | Status: DC
  Administered 2020-08-17: 500 mg via ORAL
  Filled 2020-08-16 (×2): qty 1

## 2020-08-16 MED ORDER — STERILE WATER FOR INJECTION IJ SOLN: 2.1000 mL | Freq: Once | INTRAMUSCULAR | Status: DC

## 2020-08-16 MED ORDER — STERILE WATER FOR INJECTION IJ SOLN
INTRAMUSCULAR | Status: AC
  Filled 2020-08-16: qty 10

## 2020-08-16 MED ORDER — HALOPERIDOL LACTATE 5 MG/ML IJ SOLN
10.0000 mg | Freq: Once | INTRAMUSCULAR | Status: AC
  Administered 2020-08-16: 10 mg via INTRAMUSCULAR
  Filled 2020-08-16: qty 2

## 2020-08-16 NOTE — ED Notes (Signed)
Pt refused lab draw. She pulled her arms away and said, "Oh you gave me shots and you are not drawing my blood."

## 2020-08-16 NOTE — ED Triage Notes (Signed)
Patient is screaming, verbally threatening staff, states she wants here name changed, wants to go to [place], [place] [facility], [facility]...pulling out her hair-wants a cigarette-unable to understand why she is here due to screaming-security and [employer] at bedside

## 2020-08-16 NOTE — ED Notes (Signed)
Per [employer].Marland KitchenMarland KitchenHigh Point communications [phone] can be notified once patient is discharged and requiring transport back to [facility]

## 2020-08-16 NOTE — ED Notes (Signed)
Refused [facility] telling staff to get away and yelling.
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
## 2020-08-16 NOTE — ED Notes (Signed)
Lab draws have been delayed. Patient currently showing aggressive behavior.

## 2020-08-16 NOTE — ED Notes (Signed)
Per [employer], [place] police transported patient to [facility] from parents house in [place]-patient requested to come to [facility]-refused to go to [facility] where she was recently discharged for psychiatric resources

## 2020-08-16 NOTE — ED Notes (Signed)
Patient was heard yelling and crying in the lobby from the nurses station. Later she was seen walking around trying to find a cigarette. Once in triage (patient was escorted back with security), the patient continued to be upset and agitated. At times the patient was tearful and then she would switch to anger and agitation. This Clinical research associate saw the patient screaming while making motions as if she was attempted to pull her hair out.

## 2020-08-16 NOTE — ED Provider Notes (Signed)
[facility]-EMERGENCY DEPT Provider Note   CSN: [id number] Arrival date & time: 08/16/20  1328     History Chief Complaint  Patient presents with  . Aggressive Behavior    Jessica Rios is a 34 y.o. female with a past medical history of bipolar disorder, obesity and diabetes.  The patient arrived by personal vehicle.  I was asked to come see her urgently in triage.  She is unable to provide any history due to active psychosis.  There is a level 5 caveat.  Patient is screaming, crying.  She has pulled chunks of her hair out of her head.  She is very difficult to understand because she is crying so hard but essentially told me that she is upset because people come to visit her anymore that her breasts are too small that babies have aids.  She was apparently just seen at [facility] at discharge and was supposed to follow-up at an outpatient psych facility today however she presented here prior to outpatient follow-up.  HPI     Past Medical History:  Diagnosis Date  . Bipolar affective (HCC)   . Diabetes mellitus   . High cholesterol   . Hypertension   . Sleep apnea     Patient Active Problem List   Diagnosis Date Noted  . Bipolar 1 disorder (HCC) 03/25/2020  . Diabetes (HCC) 03/25/2020  . Essential hypertension 03/25/2020  . Obesity 03/25/2020  . Rash and nonspecific skin eruption 03/25/2020    Past Surgical History:  Procedure Laterality Date  . TONSILLECTOMY       OB History   No obstetric history on file.     History reviewed. No pertinent family history.  Social History   Tobacco Use  . Smoking status: Current Every Day Smoker    Packs/day: 1.00    Types: Cigarettes  . Smokeless tobacco: Never Used  Substance Use Topics  . Alcohol use: No  . Drug use: Yes    Home Medications Prior to Admission medications   Medication Sig Start Date End Date Taking? Authorizing Provider  benztropine (COGENTIN) 0.5 MG tablet Take 1  tablet (0.5 mg total) by mouth 2 (two) times daily. Patient not taking: Reported on 07/21/2020 03/28/20   Clapacs, Jackquline Denmark, MD  dapagliflozin propanediol (FARXIGA) 5 MG TABS tablet Take 5 mg by mouth daily.    [provider]  haloperidol (HALDOL) 10 MG tablet Take 1 tablet (10 mg total) by mouth 3 (three) times daily. Patient taking differently: Take 10 mg by mouth 2 (two) times daily.  03/28/20   Clapacs, Jackquline Denmark, MD  levocetirizine (XYZAL) 5 MG tablet Take 5 mg by mouth daily.    [provider]  lisinopril (ZESTRIL) 20 MG tablet Take 1 tablet (20 mg total) by mouth daily. 03/29/20   Clapacs, Jackquline Denmark, MD  LORazepam (ATIVAN) 0.5 MG tablet Take 0.5 mg by mouth every 8 (eight) hours as needed for anxiety.    [provider]  metFORMIN (GLUCOPHAGE) 500 MG tablet Take 1 tablet (500 mg total) by mouth 2 (two) times daily with a meal. Patient not taking: Reported on 07/21/2020 03/28/20   Clapacs, Jackquline Denmark, MD  metFORMIN (GLUCOPHAGE-XR) 500 MG 24 hr tablet Take 500 mg by mouth in the morning and at bedtime.    [provider]  Oxcarbazepine (TRILEPTAL) 300 MG tablet Take 300 mg by mouth 2 (two) times daily.    [provider]  pantoprazole (PROTONIX) 40 MG tablet  Take 1 tablet (40 mg total) by mouth daily. Patient not taking: Reported on 07/21/2020 03/29/20   Clapacs, Jackquline Denmark, MD  simvastatin (ZOCOR) 80 MG tablet Take 1 tablet (80 mg total) by mouth daily at 6 PM. 03/28/20   Clapacs, Jackquline Denmark, MD  sitaGLIPtin (JANUVIA) 100 MG tablet Take 100 mg by mouth daily.    [provider]  traZODone (DESYREL) 150 MG tablet Take 375 mg by mouth at bedtime. (2.5 Tablets)    [provider]  traZODone (DESYREL) 300 MG tablet Take 1 tablet (300 mg total) by mouth at bedtime. Patient not taking: Reported on 07/21/2020 03/28/20   Clapacs, Jackquline Denmark, MD  valACYclovir (VALTREX) 500 MG tablet Take 500 mg by mouth daily.    [provider]    Allergies      Bupropion, Other, Effexor [venlafaxine hydrochloride], Geodon [ziprasidone hydrochloride], and Tegretol [carbamazepine]  Review of Systems   Review of Systems Ten systems reviewed and are negative for acute change, except as noted in the HPI.   Physical Exam Updated Vital Signs There were no vitals taken for this visit.  Physical Exam Vitals and nursing note reviewed.  Constitutional:      General: She is not in acute distress.    Appearance: She is well-developed. She is not diaphoretic.  HENT:     Head: Normocephalic and atraumatic.  Eyes:     General: No scleral icterus.    Conjunctiva/sclera: Conjunctivae normal.  Cardiovascular:     Rate and Rhythm: Normal rate and regular rhythm.     Heart sounds: Normal heart sounds. No murmur heard.  No friction rub. No gallop.   Pulmonary:     Effort: Pulmonary effort is normal. No respiratory distress.     Breath sounds: Normal breath sounds.  Abdominal:     General: Bowel sounds are normal. There is no distension.     Palpations: Abdomen is soft. There is no mass.     Tenderness: There is no abdominal tenderness. There is no guarding.  Musculoskeletal:     Cervical back: Normal range of motion.  Skin:    General: Skin is warm and dry.  Neurological:     Mental Status: She is alert and oriented to person, place, and time.  Psychiatric:        Mood and Affect: Mood is anxious. Affect is tearful and inappropriate.        Speech: Speech is rapid and pressured and tangential.        Behavior: Behavior is uncooperative, agitated and hyperactive.        Judgment: Judgment is impulsive and inappropriate.     ED Results / Procedures / Treatments   Labs (all labs ordered are listed, but only abnormal results are displayed) Labs Reviewed - No data to display  EKG None  Radiology No results found.  Procedures Procedures (including critical care time)  Medications Ordered in ED Medications - No data to display  ED Course   I have reviewed the triage vital signs and the nursing notes.  Pertinent labs & imaging results that were available during my care of the patient were reviewed by me and considered in my medical decision making (see chart for details).    MDM Rules/Calculators/A&P                          Patient here with bizarre behavior.  She has been agitated.  I have placed her under  involuntary commitment.  She has not allowed [place] to gather new sets of labs from her.  She did have labs done within 24 hours at an outside facility which I personally reviewed.  She appears medically clear for psychiatric evaluation. Final Clinical Impression(s) / ED Diagnoses Final diagnoses:  None    Rx / DC Orders ED Discharge Orders    None       Arthor Captain, PA-C 08/16/20 2207    Pricilla Loveless, MD 08/17/20 [phone]

## 2020-08-16 NOTE — ED Notes (Signed)
Per PA, Pt is going to be IVC'd.  Pt escort back into department by staff.

## 2020-08-17 ENCOUNTER — Encounter (HOSPITAL_COMMUNITY): Payer: Self-pay | Admitting: Registered Nurse

## 2020-08-17 DIAGNOSIS — Z7289 Other problems related to lifestyle: Secondary | ICD-10-CM | POA: Diagnosis not present

## 2020-08-17 DIAGNOSIS — R4689 Other symptoms and signs involving appearance and behavior: Secondary | ICD-10-CM

## 2020-08-17 LAB — CBC WITH DIFFERENTIAL/PLATELET
Abs Immature Granulocytes: 0.03 10*3/uL (ref 0.00–0.07)
Basophils Absolute: 0.1 10*3/uL (ref 0.0–0.1)
Basophils Relative: 1 %
Eosinophils Absolute: 0.2 10*3/uL (ref 0.0–0.5)
Eosinophils Relative: 2 %
HCT: 41.1 % (ref 36.0–46.0)
Hemoglobin: 12.9 g/dL (ref 12.0–15.0)
Immature Granulocytes: 0 %
Lymphocytes Relative: 40 %
Lymphs Abs: 4.2 10*3/uL — ABNORMAL HIGH (ref 0.7–4.0)
MCH: 30.1 pg (ref 26.0–34.0)
MCHC: 31.4 g/dL (ref 30.0–36.0)
MCV: 96 fL (ref 80.0–100.0)
Monocytes Absolute: 0.8 10*3/uL (ref 0.1–1.0)
Monocytes Relative: 7 %
Neutro Abs: 5.2 10*3/uL (ref 1.7–7.7)
Neutrophils Relative %: 50 %
Platelets: 145 10*3/uL — ABNORMAL LOW (ref 150–400)
RBC: 4.28 MIL/uL (ref 3.87–5.11)
RDW: 13.5 % (ref 11.5–15.5)
WBC: 10.5 10*3/uL (ref 4.0–10.5)
nRBC: 0 % (ref 0.0–0.2)

## 2020-08-17 LAB — ETHANOL: Alcohol, Ethyl (B): <10 mg/dL (ref <10)

## 2020-08-17 LAB — COMPREHENSIVE METABOLIC PANEL
ALT: 26 U/L (ref 0–44)
AST: 20 U/L (ref 15–41)
Albumin: 3.8 g/dL (ref 3.5–5.0)
Alkaline Phosphatase: 42 U/L (ref 38–126)
Anion gap: 6 (ref 5–15)
BUN: 13 mg/dL (ref 6–20)
CO2: 29 mmol/L (ref 22–32)
Calcium: 9.4 mg/dL (ref 8.9–10.3)
Chloride: 105 mmol/L (ref 98–111)
Creatinine, Ser: 0.73 mg/dL (ref 0.44–1.00)
GFR, Estimated: >60 mL/min (ref >60)
Glucose, Bld: 134 mg/dL — ABNORMAL HIGH (ref 70–99)
Potassium: 4.4 mmol/L (ref 3.5–5.1)
Sodium: 140 mmol/L (ref 135–145)
Total Bilirubin: 0.5 mg/dL (ref 0.3–1.2)
Total Protein: 6.7 g/dL (ref 6.5–8.1)

## 2020-08-17 LAB — I-STAT BETA HCG BLOOD, ED (MC, WL, AP ONLY): I-stat hCG, quantitative: <5 m[IU]/mL (ref <5)

## 2020-08-17 NOTE — Consult Note (Signed)
Telepsych Consultation   Reason for Consult:  Manic behavior Referring Physician:  Arthor Captain, PA-C Location of Patient: [facility] ED Location of Provider: [facility]  Patient Identification: Jessica Rios MRN:  [id number] Principal Diagnosis: <principal problem not specified> Diagnosis:  Active Problems:   * No active hospital problems. *   Total Time spent with patient: 30 minutes  Subjective:   Jessica Rios is a 34 y.o. female patient admitted to [facility] ED after presenting to ED unable to provide any information related to crying.  Patient also made delusional statement that her breast were to small and babies have aids.  .  Per EDP Assessment Note:  Jessica Rios is an 59 yr. female with history of Bipolar Affective. Per ED notes, "Patient is screaming, crying.  She has pulled chunks of her hair out of her head.  She is very difficult to understand because she is crying so hard but essentially told me that she is upset because people come to visit her anymore that her breasts are too small that babies have aids.  She was apparently just seen at [facility] regional hospital at discharge and was supposed to follow-up at an outpatient psych facility today however she presented here prior to outpatient follow-up".  HPI:  Jessica Rios, 34 y.o., female patient seen via tele psych by this provider, consulted with Dr. Bronwen Betters; and chart reviewed on 08/17/20.  Patient has history of multiple ED and psychiatric hospital admissions with similar behavior and presentation.  On evaluation Jessica Rios reporting, she was brought to the hospital by the police because she was trespassing "on my mom and dad house."  Patient states she told the police she did not want to go to [facility] Emergency Department "because of how they treated me there."  During assessment patient is yelling, screaming, and crying.  Patient was asked to lower her voice and to stop screaming if she wanted the conversation to  continue because it was difficulty understanding what she was doing.  Patient would stop and talk calmly for a while and then would start screaming again.  Patient screaming that she wants to go home and that it is not right that she was brought to the hospital "just because I was in my mom and dads' yard."  Patient denies suicidal/homicidal ideation, psychosis, and paranoia.  Patient reports she is diagnosed with bipolar disorder and treated by [facility] outpatient. Patient reports compliance with home medications.  Patient reports that she slept well last night, and she is eating without difficulty. During evaluation patient is alert and oriented x 4, she answers questions appropriately. Patient denies suicidal and homicidal ideations. There is no evidence of delusional thought content and patient does not appear to be responding to internal stimuli. Patient also denies symptoms of paranoia.  Currently patient also denies both auditory and visual hallucinations.    Past Psychiatric History: Bipolar disorder  Risk to Self: Suicidal Ideation: No Suicidal Intent: No Is patient at risk for suicide?: No Suicidal Plan?: No Access to Means: No What has been your use of drugs/alcohol within the last 12 months?:  (states she tried crack ) How many times?:  (multiple ) Other Self Harm Risks:  (hx of cutting, tryigng to bite tatoos off, burning ) Triggers for Past Attempts: Unpredictable, Hallucinations Intentional Self Injurious Behavior: Cutting, Burning (bitng off tattos ) Comment - Self Injurious Behavior:  (hx of cutting ) Risk to Others: Homicidal Ideation: No Thoughts of Harm to Others: No Current  Homicidal Intent: No Current Homicidal Plan: No Access to Homicidal Means: No Identified Victim:  (n/a) History of harm to others?: No Assessment of Violence: None Noted Violent Behavior Description:  (patient is calm and cooperative ) Does patient have access to weapons?: No Criminal Charges  Pending?: Yes Describe Pending Criminal Charges:  Dub Amis and possession of paraphanelia ) Does patient have a court date: Yes Court Date:  (08/14/20) Prior Inpatient Therapy: Prior Inpatient Therapy: Yes Prior Therapy Dates: June 25-28, '21 Prior Therapy Facilty/Provider(s): [facility] Reason for Treatment: A/V hallucinations Prior Outpatient Therapy: Prior Outpatient Therapy: Yes Prior Therapy Dates: Current Prior Therapy Facilty/Provider(s): [facility] Reason for Treatment: med management Does patient have an ACCT team?: No Does patient have Intensive In-House Services?  : No Does patient have [facility] services? : No Does patient have P4CC services?: No  Past Medical History:  Past Medical History:  Diagnosis Date  . Bipolar affective (HCC)   . Diabetes mellitus   . High cholesterol   . Hypertension   . Sleep apnea     Past Surgical History:  Procedure Laterality Date  . TONSILLECTOMY     Family History: History reviewed. No pertinent family history. Family Psychiatric  History: Reviewed; none reported Social History:  Social History   Substance and Sexual Activity  Alcohol Use No     Social History   Substance and Sexual Activity  Drug Use Yes    Social History   Socioeconomic History  . Marital status: Married    Spouse name: Not on file  . Number of children: Not on file  . Years of education: Not on file  . Highest education level: Not on file  Occupational History  . Not on file  Tobacco Use  . Smoking status: Current Every Day Smoker    Packs/day: 1.00    Types: Cigarettes  . Smokeless tobacco: Never Used  Substance and Sexual Activity  . Alcohol use: No  . Drug use: Yes  . Sexual activity: Not Currently  Other Topics Concern  . Not on file  Social History Narrative  . Not on file   Social Determinants of Health   Financial Resource Strain:   . Difficulty of Paying Living Expenses: Not on file  Food Insecurity:   . Worried About  Programme researcher, broadcasting/film/video in the Last Year: Not on file  . Ran Out of Food in the Last Year: Not on file  Transportation Needs:   . Lack of Transportation (Medical): Not on file  . Lack of Transportation (Non-Medical): Not on file  Physical Activity:   . Days of Exercise per Week: Not on file  . Minutes of Exercise per Session: Not on file  Stress:   . Feeling of Stress : Not on file  Social Connections:   . Frequency of Communication with Friends and Family: Not on file  . Frequency of Social Gatherings with Friends and Family: Not on file  . Attends Religious Services: Not on file  . Active Member of Clubs or Organizations: Not on file  . Attends Banker Meetings: Not on file  . Marital Status: Not on file   Additional Social History:    Allergies:   Allergies  Allergen Reactions  . Bupropion Hives  . Other Anaphylaxis    Grape juice  . Effexor [Venlafaxine Hydrochloride] Anxiety  . Geodon [Ziprasidone Hydrochloride] Rash  . Tegretol [Carbamazepine] Anxiety    Labs:  Results for orders placed or performed during the hospital encounter  of 08/16/20 (from the past 48 hour(s))  Urine rapid drug screen (hosp performed)     Status: Abnormal   Collection Time: 08/16/20  3:45 PM  Result Value Ref Range   Opiates NONE DETECTED NONE DETECTED   Cocaine NONE DETECTED NONE DETECTED   Benzodiazepines POSITIVE (A) NONE DETECTED   Amphetamines NONE DETECTED NONE DETECTED   Tetrahydrocannabinol NONE DETECTED NONE DETECTED   Barbiturates NONE DETECTED NONE DETECTED    Comment: (NOTE) DRUG SCREEN FOR MEDICAL PURPOSES ONLY.  IF CONFIRMATION IS NEEDED FOR ANY PURPOSE, NOTIFY LAB WITHIN 5 DAYS.  LOWEST DETECTABLE LIMITS FOR URINE DRUG SCREEN Drug Class                     Cutoff (ng/mL) Amphetamine and metabolites    1000 Barbiturate and metabolites    200 Benzodiazepine                 200 Tricyclics and metabolites     300 Opiates and metabolites        300 Cocaine and  metabolites        300 THC                            50 Performed at [facility], [address] [place], [place] [id number]   Respiratory Panel by RT PCR (Flu A&B, Covid) - Nasopharyngeal Swab     Status: None   Collection Time: 08/16/20  6:40 PM   Specimen: Nasopharyngeal Swab  Result Value Ref Range   SARS Coronavirus 2 by RT PCR NEGATIVE NEGATIVE    Comment: (NOTE) SARS-CoV-2 target nucleic acids are NOT DETECTED.  The SARS-CoV-2 RNA is generally detectable in upper respiratoy specimens during the acute phase of infection. The lowest concentration of SARS-CoV-2 viral copies this assay can detect is 131 copies/mL. A negative result does not preclude SARS-Cov-2 infection and should not be used as the sole basis for treatment or other patient management decisions. A negative result may occur with  improper specimen collection/handling, submission of specimen other than nasopharyngeal swab, presence of viral mutation(s) within the areas targeted by this assay, and inadequate number of viral copies (<131 copies/mL). A negative result must be combined with clinical observations, patient history, and epidemiological information. The expected result is Negative.  Fact Sheet for Patients:  https://www.moore.com/https://www.fda.gov/media/142436/download  Fact Sheet for Healthcare Providers:  https://www.young.biz/https://www.fda.gov/media/142435/download  This test is no t yet approved or cleared by the [place] FDA and  has been authorized for detection and/or diagnosis of SARS-CoV-2 by FDA under an Emergency Use Authorization (EUA). This EUA will remain  in effect (meaning this test can be used) for the duration of the COVID-19 declaration under Section 564(b)(1) of the Act, 21 U.S.C. section 360bbb-3(b)(1), unless the authorization is terminated or revoked sooner.     Influenza A by PCR NEGATIVE NEGATIVE   Influenza B by PCR NEGATIVE NEGATIVE    Comment: (NOTE) The Xpert Xpress  SARS-CoV-2/FLU/RSV assay is intended as an aid in  the diagnosis of influenza from Nasopharyngeal swab specimens and  should not be used as a sole basis for treatment. Nasal washings and  aspirates are unacceptable for Xpert Xpress SARS-CoV-2/FLU/RSV  testing.  Fact Sheet for Patients: https://www.moore.com/https://www.fda.gov/media/142436/download  Fact Sheet for Healthcare Providers: https://www.young.biz/https://www.fda.gov/media/142435/download  This test is not yet approved or cleared by the [place] FDA and  has been authorized for detection and/or diagnosis of SARS-CoV-2 by  FDA under an Emergency Use Authorization (EUA). This EUA will remain  in effect (meaning this test can be used) for the duration of the  Covid-19 declaration under Section 564(b)(1) of the Act, 21  U.S.C. section 360bbb-3(b)(1), unless the authorization is  terminated or revoked. Performed at [facility], [address], [place], [place] [address]   CBG monitoring, ED     Status: Abnormal   Collection Time: 08/16/20  7:31 PM  Result Value Ref Range   Glucose-Capillary 127 (H) 70 - 99 mg/dL    Comment: Glucose reference range applies only to samples taken after fasting for at least 8 hours.  Comprehensive metabolic panel     Status: Abnormal   Collection Time: 08/17/20  9:08 AM  Result Value Ref Range   Sodium 140 135 - 145 mmol/L   Potassium 4.4 3.5 - 5.1 mmol/L   Chloride 105 98 - 111 mmol/L   CO2 29 22 - 32 mmol/L   Glucose, Bld 134 (H) 70 - 99 mg/dL    Comment: Glucose reference range applies only to samples taken after fasting for at least 8 hours.   BUN 13 6 - 20 mg/dL   Creatinine, Ser 2.67 0.44 - 1.00 mg/dL   Calcium 9.4 8.9 - 12.4 mg/dL   Total Protein 6.7 6.5 - 8.1 g/dL   Albumin 3.8 3.5 - 5.0 g/dL   AST 20 15 - 41 U/L   ALT 26 0 - 44 U/L   Alkaline Phosphatase 42 38 - 126 U/L   Total Bilirubin 0.5 0.3 - 1.2 mg/dL   GFR, Estimated >58 >09 mL/min    Comment: (NOTE) Calculated using the CKD-EPI  Creatinine Equation (2021)    Anion gap 6 5 - 15    Comment: Performed at [facility], [address], [place], [place] [address]  Ethanol     Status: None   Collection Time: 08/17/20  9:08 AM  Result Value Ref Range   Alcohol, Ethyl (B) <10 <10 mg/dL    Comment: (NOTE) Lowest detectable limit for serum alcohol is 10 mg/dL.  For medical purposes only. Performed at [facility], [address] [place], [place] [address]   CBC with Diff     Status: Abnormal   Collection Time: 08/17/20  9:08 AM  Result Value Ref Range   WBC 10.5 4.0 - 10.5 K/uL   RBC 4.28 3.87 - 5.11 MIL/uL   Hemoglobin 12.9 12.0 - 15.0 g/dL   HCT 97.6 36 - 46 %   MCV 96.0 80.0 - 100.0 fL   MCH 30.1 26.0 - 34.0 pg   MCHC 31.4 30.0 - 36.0 g/dL   RDW 73.4 19.3 - 79.0 %   Platelets 145 (L) 150 - 400 K/uL   nRBC 0.0 0.0 - 0.2 %   Neutrophils Relative % 50 %   Neutro Abs 5.2 1.7 - 7.7 K/uL   Lymphocytes Relative 40 %   Lymphs Abs 4.2 (H) 0.7 - 4.0 K/uL   Monocytes Relative 7 %   Monocytes Absolute 0.8 0.1 - 1.0 K/uL   Eosinophils Relative 2 %   Eosinophils Absolute 0.2 0.0 - 0.5 K/uL   Basophils Relative 1 %   Basophils Absolute 0.1 0.0 - 0.1 K/uL   Immature Granulocytes 0 %   Abs Immature Granulocytes 0.03 0.00 - 0.07 K/uL    Comment: Performed at [facility], [address], [place], [place] [address]  I-Stat beta hCG blood, ED  Status: None   Collection Time: 08/17/20  9:15 AM  Result Value Ref Range   I-stat hCG, quantitative <5.0 <5 mIU/mL   Comment 3            Comment:   GEST. AGE      CONC.  (mIU/mL)   <=1 WEEK        5 - 50     2 WEEKS       50 - 500     3 WEEKS       100 - 10,000     4 WEEKS     1,000 - 30,000        FEMALE AND NON-PREGNANT FEMALE:     LESS THAN 5 mIU/mL     Medications:  Current Facility-Administered Medications  Medication Dose Route Frequency Provider Last Rate Last Admin  . atorvastatin (LIPITOR) tablet 40  mg  40 mg Oral Daily Harris, Abigail, PA-C   40 mg at 08/17/20 0940  . linagliptin (TRADJENTA) tablet 5 mg  5 mg Oral Daily Harris, Abigail, PA-C   5 mg at 08/17/20 0940  . lisinopril (ZESTRIL) tablet 20 mg  20 mg Oral Daily Harris, Abigail, PA-C   20 mg at 08/17/20 0940  . LORazepam (ATIVAN) tablet 0.5 mg  0.5 mg Oral Q8H PRN Arthor Captain, PA-C      . metFORMIN (GLUCOPHAGE-XR) 24 hr tablet 500 mg  500 mg Oral BID WC Harris, Abigail, PA-C   500 mg at 08/17/20 0939  . Oxcarbazepine (TRILEPTAL) tablet 300 mg  300 mg Oral BID Arthor Captain, PA-C   300 mg at 08/17/20 1610  . sterile water (preservative free) injection 2.1 mL  2.1 mL Injection Once Pricilla Loveless, MD      . traZODone (DESYREL) tablet 375 mg  375 mg Oral QHS Harris, Cammy Copa, PA-C   375 mg at 08/16/20 2239  . valACYclovir (VALTREX) tablet 500 mg  500 mg Oral Daily Arthor Captain, PA-C   500 mg at 08/17/20 9604   Current Outpatient Medications  Medication Sig Dispense Refill  . acetaminophen (TYLENOL) 325 MG tablet Take 650 mg by mouth every 6 (six) hours as needed for pain.    . benztropine (COGENTIN) 0.5 MG tablet Take 1 tablet (0.5 mg total) by mouth 2 (two) times daily. 60 tablet 0  . dapagliflozin propanediol (FARXIGA) 5 MG TABS tablet Take 5 mg by mouth daily.    . haloperidol (HALDOL) 10 MG tablet Take 1 tablet (10 mg total) by mouth 3 (three) times daily. (Patient taking differently: Take 10 mg by mouth 2 (two) times daily. ) 90 tablet 0  . levocetirizine (XYZAL) 5 MG tablet Take 5 mg by mouth daily.    Marland Kitchen lisinopril (ZESTRIL) 20 MG tablet Take 1 tablet (20 mg total) by mouth daily. 30 tablet 0  . metFORMIN (GLUCOPHAGE-XR) 500 MG 24 hr tablet Take 500 mg by mouth in the morning and at bedtime.    . Oxcarbazepine (TRILEPTAL) 300 MG tablet Take 300 mg by mouth 2 (two) times daily.    . simvastatin (ZOCOR) 80 MG tablet Take 1 tablet (80 mg total) by mouth daily at 6 PM. 30 tablet 0  . sitaGLIPtin (JANUVIA) 100 MG tablet Take  100 mg by mouth daily.    . traZODone (DESYREL) 150 MG tablet Take 375 mg by mouth at bedtime. (2.5 Tablets)    . valACYclovir (VALTREX) 500 MG tablet Take 500 mg by mouth daily.    . cephALEXin (KEFLEX)  500 MG capsule Take 500 mg by mouth 4 (four) times daily.    Marland Kitchen doxycycline (VIBRA-TABS) 100 MG tablet Take 100 mg by mouth 2 (two) times daily.    Marland Kitchen LORazepam (ATIVAN) 0.5 MG tablet Take 0.5 mg by mouth every 8 (eight) hours as needed for anxiety. (Patient not taking: Reported on 08/17/2020)    . metFORMIN (GLUCOPHAGE) 500 MG tablet Take 1 tablet (500 mg total) by mouth 2 (two) times daily with a meal. (Patient not taking: Reported on 08/17/2020) 60 tablet 0  . pantoprazole (PROTONIX) 40 MG tablet Take 1 tablet (40 mg total) by mouth daily. (Patient not taking: Reported on 07/21/2020) 30 tablet 0  . traZODone (DESYREL) 300 MG tablet Take 1 tablet (300 mg total) by mouth at bedtime. (Patient not taking: Reported on 07/21/2020) 30 tablet 0    Musculoskeletal: Strength & Muscle Tone: within normal limits Gait & Station: normal Patient leans: N/A  Psychiatric Specialty Exam: Physical Exam Constitutional:      Appearance: Normal appearance. She is obese. She is not ill-appearing.  Pulmonary:     Effort: Pulmonary effort is normal.  Musculoskeletal:        General: Normal range of motion.     Cervical back: Normal range of motion.  Neurological:     Mental Status: She is alert and oriented to person, place, and time.  Psychiatric:        Attention and Perception: Attention and perception normal. She does not perceive auditory or visual hallucinations.        Mood and Affect: Mood is anxious. Affect is labile and angry.        Speech: Speech normal.        Behavior: Behavior is agitated.        Thought Content: Thought content normal. Thought content is not paranoid or delusional. Thought content does not include homicidal or suicidal ideation.        Cognition and Memory: Cognition and  memory normal.        Judgment: Judgment is impulsive.     Review of Systems  Psychiatric/Behavioral: Positive for agitation. Negative for sleep disturbance and suicidal ideas (Denies). Behavioral problem: Screaming, yelling at staff. Confusion: Denies. Hallucinations: Denies. Self-injury: denies. Nervous/anxious: Stable.   All other systems reviewed and are negative.   Blood pressure 109/67, pulse 87, temperature 98.4 F (36.9 C), temperature source Oral, resp. rate 18, SpO2 95 %.There is no height or weight on file to calculate BMI.  General Appearance: Casual and Disheveled  Eye Contact:  Good  Speech:  Clear and Coherent  Volume:  Increased  Mood:  Angry and Irritable  Affect:  Inappropriate and Labile  Thought Process:  Coherent, Linear and Descriptions of Associations: Circumstantial  Orientation:  Full (Time, Place, and Person)  Thought Content:  WDL  Suicidal Thoughts:  No  Homicidal Thoughts:  No  Memory:  Immediate;   Good Recent;   Good  Judgement:  Intact  Insight:  Present and Shallow  Psychomotor Activity:  Normal  Concentration:  Concentration: Good and Attention Span: Good  Recall:  Good  Fund of Knowledge:  Good  Language:  Good  Akathisia:  No  Handed:  Right  AIMS (if indicated):     Assets:  Communication Skills Desire for Improvement Financial Resources/Insurance Intimacy Social Support  ADL's:  Intact  Cognition:  WNL  Sleep:      Treatment Plan Summary: Plan Psychiatrically clear to follow up with current outpatient psychiatric provider  Disposition:  Psychiatrically Cleared No evidence of imminent risk to self or others at present.   Patient does not meet criteria for psychiatric inpatient admission. Supportive therapy provided about ongoing stressors. Discussed crisis plan, support from social network, calling 911, coming to the Emergency Department, and calling Suicide Hotline.  This service was provided via telemedicine using a 2-way,  interactive audio and video technology.  Names of all persons participating in this telemedicine service and their role in this encounter. Name: Assunta Found Role: NP  Name: Dr. Bronwen Betters Role: Psychiatrist  Name: Jessica Rios Role: Patient  Name:  Role:     Assunta Found, NP 08/17/2020 10:49 AM

## 2020-08-17 NOTE — ED Provider Notes (Signed)
  Physical Exam  BP 109/67 (BP Location: Right Arm)   Pulse 87   Temp 98.4 F (36.9 C) (Oral)   Resp 18   SpO2 95%   Physical Exam  ED Course/Procedures     Procedures  MDM  Patient has been cleared by psychiatry for discharge.  Will either go to [facility] or with the [employer] police. IVC has been rescinded.       Benjiman Core, MD 08/17/20 1224

## 2020-08-17 NOTE — ED Notes (Signed)
Pt DC d off unit to home per provider.  Pt alert, calm, cooperative, no s/s distress. DC information and belonging given to pt. Pt ambulatory off unit, escorted and transported by [facility].
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
## 2020-08-17 NOTE — Discharge Instructions (Addendum)
For your behavioral health needs, you are advised to follow up with the [facility] ACT Team:       [facility]      [address], [address]      [place], [place] [address]      [phone]

## 2020-08-17 NOTE — ED Notes (Addendum)
Hold lab draw at this time per Canon, Charity fundraiser.

## 2020-09-05 ENCOUNTER — Encounter (HOSPITAL_COMMUNITY): Payer: Self-pay

## 2020-09-05 ENCOUNTER — Other Ambulatory Visit: Payer: Self-pay

## 2020-09-05 ENCOUNTER — Emergency Department (HOSPITAL_COMMUNITY)
Admission: EM | Admit: 2020-09-05 | Discharge: 2020-09-06 | Disposition: A | Discharge status: Home / Self Care | Payer: Medicare Other | Attending: Emergency Medicine | Admitting: Emergency Medicine

## 2020-09-05 DIAGNOSIS — Z5321 Procedure and treatment not carried out due to patient leaving prior to being seen by health care provider: Secondary | ICD-10-CM | POA: Diagnosis not present

## 2020-09-05 DIAGNOSIS — R45851 Suicidal ideations: Secondary | ICD-10-CM | POA: Insufficient documentation

## 2020-09-05 DIAGNOSIS — Z046 Encounter for general psychiatric examination, requested by authority: Secondary | ICD-10-CM | POA: Diagnosis not present

## 2020-09-05 LAB — COMPREHENSIVE METABOLIC PANEL
ALT: 19 U/L (ref 0–44)
AST: 12 U/L — ABNORMAL LOW (ref 15–41)
Albumin: 3.4 g/dL — ABNORMAL LOW (ref 3.5–5.0)
Alkaline Phosphatase: 46 U/L (ref 38–126)
Anion gap: 10 (ref 5–15)
BUN: 12 mg/dL (ref 6–20)
CO2: 24 mmol/L (ref 22–32)
Calcium: 9.2 mg/dL (ref 8.9–10.3)
Chloride: 105 mmol/L (ref 98–111)
Creatinine, Ser: 0.67 mg/dL (ref 0.44–1.00)
GFR, Estimated: >60 mL/min (ref >60)
Glucose, Bld: 103 mg/dL — ABNORMAL HIGH (ref 70–99)
Potassium: 3.8 mmol/L (ref 3.5–5.1)
Sodium: 139 mmol/L (ref 135–145)
Total Bilirubin: 0.5 mg/dL (ref 0.3–1.2)
Total Protein: 6 g/dL — ABNORMAL LOW (ref 6.5–8.1)

## 2020-09-05 LAB — CBC
HCT: 40.3 % (ref 36.0–46.0)
Hemoglobin: 13.1 g/dL (ref 12.0–15.0)
MCH: 31 pg (ref 26.0–34.0)
MCHC: 32.5 g/dL (ref 30.0–36.0)
MCV: 95.5 fL (ref 80.0–100.0)
Platelets: 151 10*3/uL (ref 150–400)
RBC: 4.22 MIL/uL (ref 3.87–5.11)
RDW: 13.6 % (ref 11.5–15.5)
WBC: 14.9 10*3/uL — ABNORMAL HIGH (ref 4.0–10.5)
nRBC: 0 % (ref 0.0–0.2)

## 2020-09-05 LAB — RAPID URINE DRUG SCREEN, HOSP PERFORMED
Amphetamines: NOT DETECTED
Barbiturates: NOT DETECTED
Benzodiazepines: NOT DETECTED
Cocaine: NOT DETECTED
Opiates: NOT DETECTED
Tetrahydrocannabinol: NOT DETECTED

## 2020-09-05 LAB — ETHANOL: Alcohol, Ethyl (B): <10 mg/dL (ref <10)

## 2020-09-05 LAB — ACETAMINOPHEN LEVEL: Acetaminophen (Tylenol), Serum: <10 ug/mL — ABNORMAL LOW (ref 10–30)

## 2020-09-05 LAB — I-STAT BETA HCG BLOOD, ED (MC, WL, AP ONLY): I-stat hCG, quantitative: <5 m[IU]/mL (ref <5)

## 2020-09-05 LAB — SALICYLATE LEVEL: Salicylate Lvl: <7 mg/dL — ABNORMAL LOW (ref 7.0–30.0)

## 2020-09-05 NOTE — ED Triage Notes (Signed)
Pt states that she is feeling suicidal because her husband posted on fb that they were divorced, pt plans to overdose on pills, pt wants to kill her family. Pt denies AVH, reports compliance with medications
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
## 2020-10-20 ENCOUNTER — Ambulatory Visit (HOSPITAL_COMMUNITY)
Admission: EM | Admit: 2020-10-20 | Discharge: 2020-10-20 | Disposition: A | Discharge status: Home / Self Care | Payer: Medicare Other | Attending: Psychiatry | Admitting: Psychiatry

## 2020-10-20 ENCOUNTER — Other Ambulatory Visit: Payer: Self-pay

## 2020-10-20 DIAGNOSIS — Z9114 Patient's other noncompliance with medication regimen: Secondary | ICD-10-CM | POA: Diagnosis not present

## 2020-10-20 DIAGNOSIS — F3130 Bipolar disorder, current episode depressed, mild or moderate severity, unspecified: Secondary | ICD-10-CM | POA: Diagnosis not present

## 2020-10-20 DIAGNOSIS — R456 Violent behavior: Secondary | ICD-10-CM | POA: Insufficient documentation

## 2020-10-20 DIAGNOSIS — R4689 Other symptoms and signs involving appearance and behavior: Secondary | ICD-10-CM | POA: Diagnosis not present

## 2020-10-20 DIAGNOSIS — F250 Schizoaffective disorder, bipolar type: Secondary | ICD-10-CM | POA: Insufficient documentation

## 2020-10-20 DIAGNOSIS — Z9151 Personal history of suicidal behavior: Secondary | ICD-10-CM | POA: Insufficient documentation

## 2020-10-20 DIAGNOSIS — F603 Borderline personality disorder: Secondary | ICD-10-CM | POA: Insufficient documentation
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
## 2020-10-20 NOTE — Discharge Instructions (Signed)

## 2020-10-20 NOTE — Progress Notes (Incomplete)
Received Jessica Rios in the [facility] with [employer], She was assessed for appropriate admission. She was removed to the interview room without incident.
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
## 2020-10-21 ENCOUNTER — Emergency Department (HOSPITAL_COMMUNITY)
Admission: EM | Admit: 2020-10-21 | Discharge: 2020-10-21 | Disposition: A | Discharge status: Home / Self Care | Payer: Medicare Other | Attending: Emergency Medicine | Admitting: Emergency Medicine

## 2020-10-21 ENCOUNTER — Telehealth (HOSPITAL_COMMUNITY): Payer: Self-pay | Admitting: Physician Assistant

## 2020-10-21 ENCOUNTER — Other Ambulatory Visit: Payer: Self-pay

## 2020-10-21 DIAGNOSIS — F17210 Nicotine dependence, cigarettes, uncomplicated: Secondary | ICD-10-CM | POA: Diagnosis not present

## 2020-10-21 DIAGNOSIS — F419 Anxiety disorder, unspecified: Secondary | ICD-10-CM | POA: Diagnosis present

## 2020-10-21 DIAGNOSIS — Z79899 Other long term (current) drug therapy: Secondary | ICD-10-CM | POA: Insufficient documentation

## 2020-10-21 DIAGNOSIS — Z7984 Long term (current) use of oral hypoglycemic drugs: Secondary | ICD-10-CM | POA: Diagnosis not present

## 2020-10-21 DIAGNOSIS — E119 Type 2 diabetes mellitus without complications: Secondary | ICD-10-CM | POA: Diagnosis not present

## 2020-10-21 DIAGNOSIS — I10 Essential (primary) hypertension: Secondary | ICD-10-CM | POA: Insufficient documentation

## 2020-10-21 MED ORDER — HYDROXYZINE HCL 25 MG PO TABS
25.0000 mg | ORAL_TABLET | Freq: Once | ORAL | Status: AC
  Administered 2020-10-21: 25 mg via ORAL
  Filled 2020-10-21: qty 1

## 2020-10-21 NOTE — Progress Notes (Signed)
TOC CM/CSW  Pt has sign Rider's Waiver.  Rider's Waiver has been faxed to [employer].  CSW contacted Psychologist, educational.  Driver:  Ivin Booty Car:  Burgess Amor, [place]  CSW will continue to follow for dc needs.  Otto Felkins Tarpley-Carter, MSW, LCSW-A Pronouns:  She, Her, Hers                  [facility] ED Transitions of CareClinical Social Worker [email or web address] [phone]

## 2020-10-21 NOTE — ED Notes (Signed)
Pt discharged. Instructions given. AAOX4. Pt very tearful stating, "I just want my husband back, but he won't talk to me. I've been trying to call him since August, but he won't answer my calls. I don't want to go back to the hotel, because I feel like the people there are trying to take my money, but I don't have anywhere else to go." Pt in no apparent distress or pain. The opportunity to ask questions was provided. Awaiting the hospital provided transportation.

## 2020-10-21 NOTE — ED Notes (Addendum)
Social Worker- Architect called and arranged transportation for the pt to be returned to [facility] on [address]

## 2020-10-21 NOTE — ED Provider Notes (Signed)
[facility]-EMERGENCY DEPT Provider Note   CSN: [id number] Arrival date & time: 10/21/20  1445     History Chief Complaint  Patient presents with  . Anxiety    Jessica Rios is a 35 y.o. female.  The history is provided by medical records and the patient. No language interpreter was used.  Anxiety      35 year old female significant history of bipolar, diabetes, hypertension, presents to ED via EMS from a hotel with request for psychiatric management.  Patient recently released from jail, she was seen by behavioral health yesterday for having suicidal ideation along with anxiety attack.  She has not been on her psychiatric medication for the past month.  She has had thoughts of harming herself without having specific plan.  She endorsed increased stress anxiety due to going through a divorce and also reported having recurrent panic attack.   Past Medical History:  Diagnosis Date  . Bipolar affective (HCC)   . Diabetes mellitus   . High cholesterol   . Hypertension   . Sleep apnea     Patient Active Problem List   Diagnosis Date Noted  . Aggressive behavior   . Self-injurious behavior   . Bipolar I disorder, most recent episode depressed (HCC) 03/25/2020  . Diabetes (HCC) 03/25/2020  . Essential hypertension 03/25/2020  . Obesity 03/25/2020  . Rash and nonspecific skin eruption 03/25/2020    Past Surgical History:  Procedure Laterality Date  . TONSILLECTOMY       OB History   No obstetric history on file.     No family history on file.  Social History   Tobacco Use  . Smoking status: Current Every Day Smoker    Packs/day: 1.00    Types: Cigarettes  . Smokeless tobacco: Never Used  Substance Use Topics  . Alcohol use: No  . Drug use: Yes    Home Medications Prior to Admission medications   Medication Sig Start Date End Date Taking? Authorizing Provider  acetaminophen (TYLENOL) 325 MG tablet Take 650 mg by mouth every 6 (six)  hours as needed for pain. 08/15/20   [provider]  benztropine (COGENTIN) 0.5 MG tablet Take 1 tablet (0.5 mg total) by mouth 2 (two) times daily. 03/28/20   Clapacs, Jackquline Denmark, MD  dapagliflozin propanediol (FARXIGA) 5 MG TABS tablet Take 5 mg by mouth daily.    [provider]  haloperidol (HALDOL) 10 MG tablet Take 1 tablet (10 mg total) by mouth 3 (three) times daily. Patient taking differently: Take 10 mg by mouth 2 (two) times daily.  03/28/20   Clapacs, Jackquline Denmark, MD  levocetirizine (XYZAL) 5 MG tablet Take 5 mg by mouth daily.    [provider]  lisinopril (ZESTRIL) 20 MG tablet Take 1 tablet (20 mg total) by mouth daily. 03/29/20   Clapacs, Jackquline Denmark, MD  LORazepam (ATIVAN) 0.5 MG tablet Take 0.5 mg by mouth every 8 (eight) hours as needed for anxiety. Patient not taking: Reported on 08/17/2020    [provider]  metFORMIN (GLUCOPHAGE) 500 MG tablet Take 1 tablet (500 mg total) by mouth 2 (two) times daily with a meal. Patient not taking: Reported on 08/17/2020 03/28/20   Clapacs, Jackquline Denmark, MD  metFORMIN (GLUCOPHAGE-XR) 500 MG 24 hr tablet Take 500 mg by mouth in the morning and at bedtime.    [provider]  Oxcarbazepine (TRILEPTAL) 300 MG tablet Take 300 mg by mouth 2 (two) times daily.    [provider]  pantoprazole (PROTONIX) 40 MG tablet Take 1 tablet (40 mg total) by mouth daily. Patient not taking: Reported on 07/21/2020 03/29/20   Clapacs, Jackquline Denmark, MD  simvastatin (ZOCOR) 80 MG tablet Take 1 tablet (80 mg total) by mouth daily at 6 PM. 03/28/20   Clapacs, Jackquline Denmark, MD  sitaGLIPtin (JANUVIA) 100 MG tablet Take 100 mg by mouth daily.    [provider]  traZODone (DESYREL) 150 MG tablet Take 375 mg by mouth at bedtime. (2.5 Tablets)    [provider]  traZODone (DESYREL) 300 MG tablet Take 1 tablet (300 mg total) by mouth at bedtime. Patient not taking: Reported on 07/21/2020 03/28/20   Clapacs, Jackquline Denmark, MD  valACYclovir  (VALTREX) 500 MG tablet Take 500 mg by mouth daily.    [provider]    Allergies    Bupropion, Other, Effexor [venlafaxine hydrochloride], Geodon [ziprasidone hydrochloride], and Tegretol [carbamazepine]  Review of Systems   Review of Systems  All other systems reviewed and are negative.   Physical Exam Updated Vital Signs BP 137/83 (BP Location: Left Arm)   Pulse 80   Temp 98.3 F (36.8 C) (Oral)   Resp 19   SpO2 98% Comment: Simultaneous filing. User may not have seen previous data.  Physical Exam Vitals and nursing note reviewed.  Constitutional:      General: She is not in acute distress.    Appearance: She is well-developed and well-nourished. She is obese.  HENT:     Head: Atraumatic.  Eyes:     Conjunctiva/sclera: Conjunctivae normal.  Cardiovascular:     Rate and Rhythm: Normal rate and regular rhythm.  Musculoskeletal:     Cervical back: Neck supple.  Skin:    Findings: No rash.  Neurological:     Mental Status: She is alert.  Psychiatric:        Mood and Affect: Mood and affect normal. Affect is tearful.        Speech: Speech normal.        Behavior: Behavior is cooperative.        Thought Content: Thought content does not include homicidal or suicidal ideation.     ED Results / Procedures / Treatments   Labs (all labs ordered are listed, but only abnormal results are displayed) Labs Reviewed - No data to display  EKG None  Radiology No results found.  Procedures Procedures (including critical care time)  Medications Ordered in ED Medications  hydrOXYzine (ATARAX/VISTARIL) tablet 25 mg (has no administration in time range)    ED Course  I have reviewed the triage vital signs and the nursing notes.  Pertinent labs & imaging results that were available during my care of the patient were reviewed by me and considered in my medical decision making (see chart for details).    MDM Rules/Calculators/A&P                           BP 137/83 (BP Location: Left Arm)   Pulse 80   Temp 98.3 F (36.8 C) (Oral)   Resp 19   SpO2 98% Comment: Simultaneous filing. User may not have seen previous data.  Final Clinical Impression(s) / ED Diagnoses Final diagnoses:  Anxiety    Rx / DC Orders ED Discharge Orders    None     Patient with significant psychiatric history recently released from jail, was seen and evaluated by behavioral health yesterday for suicidal ideation and anxiety  attack and not having her psychiatric medication for the month.  She was medically and psychiatrically cleared and was discharged to hotel.  She returns today with same complaint.  At this time she is tearful however cooperative.  She has vague suicidal ideation without any specific plan and this is not new.  I did reach out to talk to her case manager, Sander Radon, at [facility] who is aware of the situation and currently actively working on setting up outpatient follow-up as well as psychiatric follow-up.  She also reach out to patient's mom to bring over patient's psychiatric medications in the meantime.  I will give patient a course of Vistaril for anxiety but otherwise she is stable for discharge.     Fayrene Helper, PA-C 10/21/20 1543    Charlynne Pander, MD 10/21/20 [phone]

## 2020-10-21 NOTE — Progress Notes (Signed)
..   Transition of Care [facility]) - Emergency Department Mini Assessment   Patient Details  Name: Jessica Rios MRN: [id number] Date of Birth: Jun 23, 1986  Transition of Care [facility]) CM/SW Contact:    Lossie Faes Tarpley-Carter, LCSWA Phone Number: 10/21/2020, 4:34 PM   Clinical Narrative: [facility] CM/CSW consulted with pt, nurse at bedside.  Pt is in need of transportation back to [facility]/[address] [place], [place] [address].  Elizar Alpern Tarpley-Carter, MSW, LCSW-A Pronouns:  She, Her, Hers                  [facility] ED Transitions of CareClinical Social Worker [email or web address] [phone]   ED Mini Assessment: What brought you to the Emergency Department? : Anxiety  Barriers to Discharge: No Barriers Identified        Interventions which prevented an admission or readmission: Transportation Screening    Patient Contact and Communications        ,          Patient states their goals for this hospitalization and ongoing recovery are:: Pt is in need of transportation back to [facility]. CMS Medicare.gov Compare Post Acute Care list provided to:: Patient Choice offered to / list presented to : Patient  Admission diagnosis:  Anxiety Psych Patient Active Problem List   Diagnosis Date Noted  . Aggressive behavior   . Self-injurious behavior   . Bipolar I disorder, most recent episode depressed (HCC) 03/25/2020  . Diabetes (HCC) 03/25/2020  . Essential hypertension 03/25/2020  . Obesity 03/25/2020  . Rash and nonspecific skin eruption 03/25/2020   PCP:  Darryl Lent, PA-C Pharmacy:   [facility] [phone] [facility] [place], [place] - [address] [place] [address] Phone: [phone] Fax: [phone]  [facility] [address] [place], [place] - [address] [place] [address] Phone: [phone] Fax: [phone]

## 2020-10-21 NOTE — Telephone Encounter (Signed)
Care Management - Follow Up [facility] Discharges   Writer attempted to make contact with patient today and was unsuccessful.  Writer was able to leave a HIPPA compliant voice message and will await callback.   

## 2020-10-21 NOTE — Discharge Instructions (Signed)
Your social worker is actively working on setting you up with outpatient follow-up with your psychiatrist as well your primary care provider.  Your parent will likely bring your medications in the near future.  Use resources below.

## 2020-10-21 NOTE — ED Triage Notes (Signed)
PT BIBA from local hotel-  Per EMS- Pt reports pt has been in jail and for last 2 months has not been receiving her psych meds. C/o anxiety, emotionally upset, HA, generalized body aches, insomnia.

## 2020-10-23 ENCOUNTER — Emergency Department (HOSPITAL_COMMUNITY)
Admission: EM | Admit: 2020-10-23 | Discharge: 2020-10-23 | Disposition: A | Discharge status: Other Acute Inpatient Hospital | Payer: Medicare Other | Attending: Emergency Medicine | Admitting: Emergency Medicine

## 2020-10-23 ENCOUNTER — Other Ambulatory Visit: Payer: Self-pay

## 2020-10-23 ENCOUNTER — Ambulatory Visit (INDEPENDENT_AMBULATORY_CARE_PROVIDER_SITE_OTHER)
Admission: EM | Admit: 2020-10-23 | Discharge: 2020-10-24 | Disposition: A | Discharge status: Home / Self Care | Payer: Medicare Other | Source: Home / Self Care | Attending: Psychiatry | Admitting: Psychiatry

## 2020-10-23 DIAGNOSIS — Z5901 Sheltered homelessness: Secondary | ICD-10-CM | POA: Insufficient documentation

## 2020-10-23 DIAGNOSIS — F250 Schizoaffective disorder, bipolar type: Secondary | ICD-10-CM

## 2020-10-23 DIAGNOSIS — Z888 Allergy status to other drugs, medicaments and biological substances status: Secondary | ICD-10-CM | POA: Insufficient documentation

## 2020-10-23 DIAGNOSIS — E119 Type 2 diabetes mellitus without complications: Secondary | ICD-10-CM | POA: Insufficient documentation

## 2020-10-23 DIAGNOSIS — Z560 Unemployment, unspecified: Secondary | ICD-10-CM | POA: Insufficient documentation

## 2020-10-23 DIAGNOSIS — Z20822 Contact with and (suspected) exposure to covid-19: Secondary | ICD-10-CM | POA: Insufficient documentation

## 2020-10-23 DIAGNOSIS — F410 Panic disorder [episodic paroxysmal anxiety] without agoraphobia: Secondary | ICD-10-CM | POA: Diagnosis present

## 2020-10-23 DIAGNOSIS — R45851 Suicidal ideations: Secondary | ICD-10-CM | POA: Insufficient documentation

## 2020-10-23 DIAGNOSIS — Z7984 Long term (current) use of oral hypoglycemic drugs: Secondary | ICD-10-CM | POA: Insufficient documentation

## 2020-10-23 DIAGNOSIS — F32A Depression, unspecified: Secondary | ICD-10-CM | POA: Insufficient documentation

## 2020-10-23 DIAGNOSIS — F17210 Nicotine dependence, cigarettes, uncomplicated: Secondary | ICD-10-CM | POA: Insufficient documentation

## 2020-10-23 DIAGNOSIS — Z886 Allergy status to analgesic agent status: Secondary | ICD-10-CM | POA: Insufficient documentation

## 2020-10-23 DIAGNOSIS — Z79899 Other long term (current) drug therapy: Secondary | ICD-10-CM | POA: Diagnosis not present

## 2020-10-23 DIAGNOSIS — I10 Essential (primary) hypertension: Secondary | ICD-10-CM | POA: Diagnosis not present

## 2020-10-23 LAB — SALICYLATE LEVEL: Salicylate Lvl: <7 mg/dL — ABNORMAL LOW (ref 7.0–30.0)

## 2020-10-23 LAB — COMPREHENSIVE METABOLIC PANEL
ALT: 17 U/L (ref 0–44)
AST: 16 U/L (ref 15–41)
Albumin: 4.2 g/dL (ref 3.5–5.0)
Alkaline Phosphatase: 58 U/L (ref 38–126)
Anion gap: 9 (ref 5–15)
BUN: 8 mg/dL (ref 6–20)
CO2: 25 mmol/L (ref 22–32)
Calcium: 9.8 mg/dL (ref 8.9–10.3)
Chloride: 108 mmol/L (ref 98–111)
Creatinine, Ser: 0.78 mg/dL (ref 0.44–1.00)
GFR, Estimated: >60 mL/min (ref >60)
Glucose, Bld: 126 mg/dL — ABNORMAL HIGH (ref 70–99)
Potassium: 3.7 mmol/L (ref 3.5–5.1)
Sodium: 142 mmol/L (ref 135–145)
Total Bilirubin: 0.5 mg/dL (ref 0.3–1.2)
Total Protein: 7.3 g/dL (ref 6.5–8.1)

## 2020-10-23 LAB — RAPID URINE DRUG SCREEN, HOSP PERFORMED
Amphetamines: NOT DETECTED
Barbiturates: NOT DETECTED
Benzodiazepines: NOT DETECTED
Cocaine: NOT DETECTED
Opiates: NOT DETECTED
Tetrahydrocannabinol: NOT DETECTED

## 2020-10-23 LAB — ETHANOL: Alcohol, Ethyl (B): <10 mg/dL (ref <10)

## 2020-10-23 LAB — CBC
HCT: 46.4 % — ABNORMAL HIGH (ref 36.0–46.0)
Hemoglobin: 15.3 g/dL — ABNORMAL HIGH (ref 12.0–15.0)
MCH: 30.7 pg (ref 26.0–34.0)
MCHC: 33 g/dL (ref 30.0–36.0)
MCV: 93 fL (ref 80.0–100.0)
Platelets: 254 10*3/uL (ref 150–400)
RBC: 4.99 MIL/uL (ref 3.87–5.11)
RDW: 12.4 % (ref 11.5–15.5)
WBC: 15.5 10*3/uL — ABNORMAL HIGH (ref 4.0–10.5)
nRBC: 0 % (ref 0.0–0.2)

## 2020-10-23 LAB — RESP PANEL BY RT-PCR (FLU A&B, COVID) ARPGX2
Influenza A by PCR: NEGATIVE
Influenza B by PCR: NEGATIVE
SARS Coronavirus 2 by RT PCR: NEGATIVE

## 2020-10-23 LAB — I-STAT BETA HCG BLOOD, ED (MC, WL, AP ONLY): I-stat hCG, quantitative: <5 m[IU]/mL (ref <5)

## 2020-10-23 LAB — ACETAMINOPHEN LEVEL: Acetaminophen (Tylenol), Serum: <10 ug/mL — ABNORMAL LOW (ref 10–30)

## 2020-10-23 MED ORDER — LISINOPRIL 20 MG PO TABS
20.0000 mg | ORAL_TABLET | Freq: Every day | ORAL | Status: DC
  Administered 2020-10-24: 20 mg via ORAL
  Filled 2020-10-23: qty 1

## 2020-10-23 MED ORDER — LORAZEPAM 0.5 MG PO TABS
0.5000 mg | ORAL_TABLET | Freq: Three times a day (TID) | ORAL | Status: DC | PRN
  Administered 2020-10-23: 0.5 mg via ORAL
  Filled 2020-10-23: qty 1

## 2020-10-23 MED ORDER — ACETAMINOPHEN 325 MG PO TABS: 650.0000 mg | ORAL_TABLET | Freq: Four times a day (QID) | ORAL | Status: DC | PRN

## 2020-10-23 MED ORDER — LORAZEPAM 1 MG PO TABS
1.0000 mg | ORAL_TABLET | Freq: Once | ORAL | Status: AC
  Administered 2020-10-23: 1 mg via ORAL
  Filled 2020-10-23: qty 1

## 2020-10-23 MED ORDER — DAPAGLIFLOZIN PROPANEDIOL 5 MG PO TABS
5.0000 mg | ORAL_TABLET | Freq: Every day | ORAL | Status: DC
  Filled 2020-10-23: qty 1

## 2020-10-23 MED ORDER — MAGNESIUM HYDROXIDE 400 MG/5ML PO SUSP: 30.0000 mL | Freq: Every day | ORAL | Status: DC | PRN

## 2020-10-23 MED ORDER — LINAGLIPTIN 5 MG PO TABS
5.0000 mg | ORAL_TABLET | Freq: Every day | ORAL | Status: DC
  Administered 2020-10-24: 5 mg via ORAL
  Filled 2020-10-23: qty 1

## 2020-10-23 MED ORDER — BENZTROPINE MESYLATE 0.5 MG PO TABS
0.5000 mg | ORAL_TABLET | Freq: Two times a day (BID) | ORAL | Status: DC
  Administered 2020-10-23 – 2020-10-24 (×2): 0.5 mg via ORAL
  Filled 2020-10-23 (×2): qty 1

## 2020-10-23 MED ORDER — HALOPERIDOL 5 MG PO TABS
10.0000 mg | ORAL_TABLET | Freq: Every day | ORAL | Status: DC
  Administered 2020-10-23 – 2020-10-24 (×2): 10 mg via ORAL
  Filled 2020-10-23 (×2): qty 2

## 2020-10-23 MED ORDER — LORATADINE 10 MG PO TABS
10.0000 mg | ORAL_TABLET | Freq: Every day | ORAL | Status: DC
  Administered 2020-10-24: 10 mg via ORAL
  Filled 2020-10-23: qty 1

## 2020-10-23 MED ORDER — METFORMIN HCL ER 500 MG PO TB24
500.0000 mg | ORAL_TABLET | Freq: Two times a day (BID) | ORAL | Status: DC
  Administered 2020-10-24: 500 mg via ORAL
  Filled 2020-10-23: qty 1

## 2020-10-23 MED ORDER — ATORVASTATIN CALCIUM 40 MG PO TABS
40.0000 mg | ORAL_TABLET | Freq: Every day | ORAL | Status: DC
  Administered 2020-10-24: 40 mg via ORAL
  Filled 2020-10-23: qty 1

## 2020-10-23 MED ORDER — ALUM & MAG HYDROXIDE-SIMETH 200-200-20 MG/5ML PO SUSP: 30.0000 mL | ORAL | Status: DC | PRN

## 2020-10-23 MED ORDER — LEVOCETIRIZINE DIHYDROCHLORIDE 5 MG PO TABS: 5.0000 mg | ORAL_TABLET | Freq: Every day | ORAL | Status: DC

## 2020-10-23 NOTE — ED Notes (Signed)
Patient given chips

## 2020-10-23 NOTE — ED Provider Notes (Signed)
Behavioral Health Admission H&P [facility] & OBS)  Date: 10/23/20 Patient Name: Jessica Rios MRN: [id number] Chief Complaint: No chief complaint on file.     Diagnoses:  Final diagnoses:  None    HPI: Jessica Rios is a 35 year old female who presented to [facility] for suicidal ideations with a plan to overdose on personal medications and complaints of panic attacks. Patient has a past psychiatric history of bipolar affective disorder, inpatient hospitalizations including multiple ED encounters. Patient last presented to [facility] 10/20/2020 for similar presentation after being released from jail. Patient does have legal guardian Jessica Rios, [employer]) and receives outpatient medication management via [facility] Merino).   Per TTS assessment: Jessica Rios 10/24/2019 "Spoke with Pt's legal guardian, Jessica Rios with [facility] DSS [phone]) who stated that she would welcome Pt's transfer to [facility] to restart meds and for overnight obs.  The plan is for Pt to be discharged in AM and for client to see her NP at [facility] this coming Thursday.  Consulted with Maxie Barb, NP, who determined that Pt is suitable for transfer to [facility]".  On unit patient is currently calm and cooperative. States she is feeling "much better" and "doesn't feel like I'm having a panic attack anymore".   PHQ 2-9:  Flowsheet Row ED from 10/23/2020 in [facility]  Thoughts that you would be better off dead, or of hurting yourself in some way More than half the days  PHQ-9 Total Score 15      Flowsheet Row ED from 10/23/2020 in [facility] [place] [facility] ED from 10/21/2020 in [facility]-EM[facility]GENCY DEPT ED from 09/05/2020 in [facility] EMERGENCY DEPARTMENT  C-SSRS RISK CATEGORY High Risk No Risk Error: Question 6 not populated       Total Time spent with patient: 15  minutes  Musculoskeletal  Strength & Muscle Tone: within normal limits Gait & Station: normal Patient leans: N/A  Psychiatric Specialty Exam  Presentation General Appearance: Casual; Disheveled  Eye Contact:Fair  Speech:Clear and Coherent; Normal Rate  Speech Volume:Normal  Handedness:Right   Mood and Affect  Mood:Euthymic  Affect:Appropriate; Congruent   Thought Process  Thought Processes:Coherent; Goal Directed  Descriptions of Associations:Intact  Orientation:Full (Time, Place and Person)  Thought Content:Logical  Hallucinations:Hallucinations: None  Ideas of Reference:None  Suicidal Thoughts:Suicidal Thoughts: No  Homicidal Thoughts:Homicidal Thoughts: No   Sensorium  Memory:Immediate Fair; Recent Fair  Judgment:Intact  Insight:Shallow   Executive Functions  Concentration:Fair  Attention Span:Fair  Recall:Fair  Fund of Knowledge:Fair  Language:Fair   Psychomotor Activity  Psychomotor Activity:Psychomotor Activity: Normal   Assets  Assets:Housing; Health and safety inspector; Social Support   Sleep  Sleep:Sleep: Fair   Physical Exam Psychiatric:        Attention and Perception: Attention and perception normal.        Mood and Affect: Affect is blunt.        Speech: Speech normal.        Behavior: Behavior is cooperative.        Thought Content: Thought content normal.        Cognition and Memory: Cognition and memory normal.        Judgment: Judgment is impulsive.    Review of Systems  Psychiatric/Behavioral: Positive for depression.  All other systems reviewed and are negative.   Blood pressure (!) 145/60, pulse 90, temperature 98 F (36.7 C), resp. rate 20, SpO2 99 %. There is no height or  weight on file to calculate BMI.  Past Psychiatric History: Bipolar I Disorder, Schizoaffective Disorder bipolar type  Is the patient at risk to self? No  Has the patient been a risk to self in the past 6 months? Yes .    Has  the patient been a risk to self within the distant past? Yes   Is the patient a risk to others? patient denies  Has the patient been a risk to others in the past 6 months? patient denies  Has the patient been a risk to others within the distant past? Yes   Past Medical History:  Past Medical History:  Diagnosis Date  . Bipolar affective (HCC)   . Diabetes mellitus   . High cholesterol   . Hypertension   . Sleep apnea     Past Surgical History:  Procedure Laterality Date  . TONSILLECTOMY      Family History: No family history on file.  Social History:  Social History   Socioeconomic History  . Marital status: Married    Spouse name: Not on file  . Number of children: Not on file  . Years of education: Not on file  . Highest education level: Not on file  Occupational History  . Not on file  Tobacco Use  . Smoking status: Current Every Day Smoker    Packs/day: 1.00    Types: Cigarettes  . Smokeless tobacco: Never Used  Substance and Sexual Activity  . Alcohol use: No  . Drug use: Yes  . Sexual activity: Not Currently  Other Topics Concern  . Not on file  Social History Narrative  . Not on file   Social Determinants of Health   Financial Resource Strain: Not on file  Food Insecurity: Not on file  Transportation Needs: Not on file  Physical Activity: Not on file  Stress: Not on file  Social Connections: Not on file  Intimate Partner Violence: Not on file    SDOH:  SDOH Screenings   Alcohol Screen: Low Risk   . Last Alcohol Screening Score (AUDIT): 0  Depression (PHQ2-9): Medium Risk  . PHQ-2 Score: 15  Financial Resource Strain: Not on file  Food Insecurity: Not on file  Housing: Not on file  Physical Activity: Not on file  Social Connections: Not on file  Stress: Not on file  Tobacco Use: High Risk  . Smoking Tobacco Use: Current Every Day Smoker  . Smokeless Tobacco Use: Never Used  Transportation Needs: Not on file    Last Labs:  Admission on  10/23/2020, Discharged on 10/23/2020  Component Date Value Ref Range Status  . Sodium 10/23/2020 142  135 - 145 mmol/L Final  . Potassium 10/23/2020 3.7  3.5 - 5.1 mmol/L Final  . Chloride 10/23/2020 108  98 - 111 mmol/L Final  . CO2 10/23/2020 25  22 - 32 mmol/L Final  . Glucose, Bld 10/23/2020 126* 70 - 99 mg/dL Final   Glucose reference range applies only to samples taken after fasting for at least 8 hours.  . BUN 10/23/2020 8  6 - 20 mg/dL Final  . Creatinine, Ser 10/23/2020 0.78  0.44 - 1.00 mg/dL Final  . Calcium 16/10/960401/23/2022 9.8  8.9 - 10.3 mg/dL Final  . Total Protein 10/23/2020 7.3  6.5 - 8.1 g/dL Final  . Albumin 54/09/811901/23/2022 4.2  3.5 - 5.0 g/dL Final  . AST 14/78/295601/23/2022 16  15 - 41 U/L Final  . ALT 10/23/2020 17  0 - 44 U/L Final  .  Alkaline Phosphatase 10/23/2020 58  38 - 126 U/L Final  . Total Bilirubin 10/23/2020 0.5  0.3 - 1.2 mg/dL Final  . GFR, Estimated 10/23/2020 >60  >60 mL/min Final   Comment: (NOTE) Calculated using the CKD-EPI Creatinine Equation (2021)   . Anion gap 10/23/2020 9  5 - 15 Final   Performed at [facility], [address], [place], [place] [address]  . Alcohol, Ethyl (B) 10/23/2020 <10  <10 mg/dL Final   Comment: (NOTE) Lowest detectable limit for serum alcohol is 10 mg/dL.  For medical purposes only. Performed at [facility], [address], [place], [place] [address]   . Salicylate Lvl 10/23/2020 <7.0* 7.0 - 30.0 mg/dL Final   Performed at [facility], [address] [place], [place] [address]  . Acetaminophen (Tylenol), Serum 10/23/2020 <10* 10 - 30 ug/mL Final   Comment: (NOTE) Therapeutic concentrations vary significantly. A range of 10-30 ug/mL  may be an effective concentration for many patients. However, some  are best treated at concentrations outside of this range. Acetaminophen concentrations >150 ug/mL at 4 hours after ingestion  and >50 ug/mL at 12 hours after ingestion are  often associated with  toxic reactions.  Performed at [facility], [address], [place], [place] [address]   . WBC 10/23/2020 15.5* 4.0 - 10.5 K/uL Final  . RBC 10/23/2020 4.99  3.87 - 5.11 MIL/uL Final  . Hemoglobin 10/23/2020 15.3* 12.0 - 15.0 g/dL Final  . HCT 72/53/6644 46.4* 36.0 - 46.0 % Final  . MCV 10/23/2020 93.0  80.0 - 100.0 fL Final  . MCH 10/23/2020 30.7  26.0 - 34.0 pg Final  . MCHC 10/23/2020 33.0  30.0 - 36.0 g/dL Final  . RDW 03/47/4259 12.4  11.5 - 15.5 % Final  . Platelets 10/23/2020 254  150 - 400 K/uL Final  . nRBC 10/23/2020 0.0  0.0 - 0.2 % Final   Performed at [facility], [address], [place], [place] [address]  . Opiates 10/23/2020 NONE DETECTED  NONE DETECTED Final  . Cocaine 10/23/2020 NONE DETECTED  NONE DETECTED Final  . Benzodiazepines 10/23/2020 NONE DETECTED  NONE DETECTED Final  . Amphetamines 10/23/2020 NONE DETECTED  NONE DETECTED Final  . Tetrahydrocannabinol 10/23/2020 NONE DETECTED  NONE DETECTED Final  . Barbiturates 10/23/2020 NONE DETECTED  NONE DETECTED Final   Comment: (NOTE) DRUG SCREEN FOR MEDICAL PURPOSES ONLY.  IF CONFIRMATION IS NEEDED FOR ANY PURPOSE, NOTIFY LAB WITHIN 5 DAYS.  LOWEST DETECTABLE LIMITS FOR URINE DRUG SCREEN Drug Class                     Cutoff (ng/mL) Amphetamine and metabolites    1000 Barbiturate and metabolites    200 Benzodiazepine                 200 Tricyclics and metabolites     300 Opiates and metabolites        300 Cocaine and metabolites        300 THC                            50 Performed at [facility], [address] [place], [place] [address]   . I-stat hCG, quantitative 10/23/2020 <5.0  <5 mIU/mL Final  . Comment 3 10/23/2020          Final   Comment:   GEST.  AGE      CONC.  (mIU/mL)   <=1 WEEK        5 - 50     2 WEEKS       50 - 500     3 WEEKS       100 - 10,000     4 WEEKS     1,000 - 30,000        FEMALE AND  NON-PREGNANT FEMALE:     LESS THAN 5 mIU/mL   . SARS Coronavirus 2 by RT PCR 10/23/2020 NEGATIVE  NEGATIVE Final   Comment: (NOTE) SARS-CoV-2 target nucleic acids are NOT DETECTED.  The SARS-CoV-2 RNA is generally detectable in upper respiratory specimens during the acute phase of infection. The lowest concentration of SARS-CoV-2 viral copies this assay can detect is 138 copies/mL. A negative result does not preclude SARS-Cov-2 infection and should not be used as the sole basis for treatment or other patient management decisions. A negative result may occur with  improper specimen collection/handling, submission of specimen other than nasopharyngeal swab, presence of viral mutation(s) within the areas targeted by this assay, and inadequate number of viral copies(<138 copies/mL). A negative result must be combined with clinical observations, patient history, and epidemiological information. The expected result is Negative.  Fact Sheet for Patients:  BloggerCourse.comhttps://www.fda.gov/media/152166/download  Fact Sheet for Healthcare Providers:  SeriousBroker.ithttps://www.fda.gov/media/152162/download  This test is no                          t yet approved or cleared by the [place] FDA and  has been authorized for detection and/or diagnosis of SARS-CoV-2 by FDA under an Emergency Use Authorization (EUA). This EUA will remain  in effect (meaning this test can be used) for the duration of the COVID-19 declaration under Section 564(b)(1) of the Act, 21 U.S.C.section 360bbb-3(b)(1), unless the authorization is terminated  or revoked sooner.      . Influenza A by PCR 10/23/2020 NEGATIVE  NEGATIVE Final  . Influenza B by PCR 10/23/2020 NEGATIVE  NEGATIVE Final   Comment: (NOTE) The Xpert Xpress SARS-CoV-2/FLU/RSV plus assay is intended as an aid in the diagnosis of influenza from Nasopharyngeal swab specimens and should not be used as a sole basis for treatment. Nasal washings and aspirates are  unacceptable for Xpert Xpress SARS-CoV-2/FLU/RSV testing.  Fact Sheet for Patients: BloggerCourse.comhttps://www.fda.gov/media/152166/download  Fact Sheet for Healthcare Providers: SeriousBroker.ithttps://www.fda.gov/media/152162/download  This test is not yet approved or cleared by the [place] FDA and has been authorized for detection and/or diagnosis of SARS-CoV-2 by FDA under an Emergency Use Authorization (EUA). This EUA will remain in effect (meaning this test can be used) for the duration of the COVID-19 declaration under Section 564(b)(1) of the Act, 21 U.S.C. section 360bbb-3(b)(1), unless the authorization is terminated or revoked.  Performed at [facility], [address] [place], [place] [id number]   Admission on 09/05/2020, Discharged on 09/06/2020  Component Date Value Ref Range Status  . Sodium 09/05/2020 139  135 - 145 mmol/L Final  . Potassium 09/05/2020 3.8  3.5 - 5.1 mmol/L Final  . Chloride 09/05/2020 105  98 - 111 mmol/L Final  . CO2 09/05/2020 24  22 - 32 mmol/L Final  . Glucose, Bld 09/05/2020 103* 70 - 99 mg/dL Final   Glucose reference range applies only to samples taken after fasting for at least 8 hours.  . BUN 09/05/2020 12  6 - 20 mg/dL Final  .  Creatinine, Ser 09/05/2020 0.67  0.44 - 1.00 mg/dL Final  . Calcium 16/07/9603 9.2  8.9 - 10.3 mg/dL Final  . Total Protein 09/05/2020 6.0* 6.5 - 8.1 g/dL Final  . Albumin 54/06/8118 3.4* 3.5 - 5.0 g/dL Final  . AST 14/78/2956 12* 15 - 41 U/L Final  . ALT 09/05/2020 19  0 - 44 U/L Final  . Alkaline Phosphatase 09/05/2020 46  38 - 126 U/L Final  . Total Bilirubin 09/05/2020 0.5  0.3 - 1.2 mg/dL Final  . GFR, Estimated 09/05/2020 >60  >60 mL/min Final   Comment: (NOTE) Calculated using the CKD-EPI Creatinine Equation (2021)   . Anion gap 09/05/2020 10  5 - 15 Final   Performed at [facility], [address] [place], [place] [address]  . Alcohol, Ethyl (B) 09/05/2020 <10  <10 mg/dL Final   Comment:  (NOTE) Lowest detectable limit for serum alcohol is 10 mg/dL.  For medical purposes only. Performed at [facility], [address], [place], [place] [address]   . Salicylate Lvl 09/05/2020 <7.0* 7.0 - 30.0 mg/dL Final   Performed at [facility], [address], [place], [place] [address]  . Acetaminophen (Tylenol), Serum 09/05/2020 <10* 10 - 30 ug/mL Final   Comment: (NOTE) Therapeutic concentrations vary significantly. A range of 10-30 ug/mL  may be an effective concentration for many patients. However, some  are best treated at concentrations outside of this range. Acetaminophen concentrations >150 ug/mL at 4 hours after ingestion  and >50 ug/mL at 12 hours after ingestion are often associated with  toxic reactions.  Performed at [facility], [address], [place], [place] [address]   . WBC 09/05/2020 14.9* 4.0 - 10.5 K/uL Final  . RBC 09/05/2020 4.22  3.87 - 5.11 MIL/uL Final  . Hemoglobin 09/05/2020 13.1  12.0 - 15.0 g/dL Final  . HCT 32/44/0102 40.3  36.0 - 46.0 % Final  . MCV 09/05/2020 95.5  80.0 - 100.0 fL Final  . MCH 09/05/2020 31.0  26.0 - 34.0 pg Final  . MCHC 09/05/2020 32.5  30.0 - 36.0 g/dL Final  . RDW 72/53/6644 13.6  11.5 - 15.5 % Final  . Platelets 09/05/2020 151  150 - 400 K/uL Final  . nRBC 09/05/2020 0.0  0.0 - 0.2 % Final   Performed at [facility], [address] [place], [place] [address]  . Opiates 09/05/2020 NONE DETECTED  NONE DETECTED Final  . Cocaine 09/05/2020 NONE DETECTED  NONE DETECTED Final  . Benzodiazepines 09/05/2020 NONE DETECTED  NONE DETECTED Final  . Amphetamines 09/05/2020 NONE DETECTED  NONE DETECTED Final  . Tetrahydrocannabinol 09/05/2020 NONE DETECTED  NONE DETECTED Final  . Barbiturates 09/05/2020 NONE DETECTED  NONE DETECTED Final   Comment: (NOTE) DRUG SCREEN FOR MEDICAL PURPOSES ONLY.  IF CONFIRMATION IS NEEDED FOR ANY PURPOSE, NOTIFY LAB WITHIN 5 DAYS.  LOWEST DETECTABLE LIMITS FOR URINE  DRUG SCREEN Drug Class                     Cutoff (ng/mL) Amphetamine and metabolites    1000 Barbiturate and metabolites    200 Benzodiazepine                 200 Tricyclics and metabolites     300 Opiates and metabolites        300 Cocaine and metabolites        300 THC  50 Performed at [facility], [address] [place], [place] [address]   . I-stat hCG, quantitative 09/05/2020 <5.0  <5 mIU/mL Final  . Comment 3 09/05/2020          Final   Comment:   GEST. AGE      CONC.  (mIU/mL)   <=1 WEEK        5 - 50     2 WEEKS       50 - 500     3 WEEKS       100 - 10,000     4 WEEKS     1,000 - 30,000        FEMALE AND NON-PREGNANT FEMALE:     LESS THAN 5 mIU/mL   Admission on 08/16/2020, Discharged on 08/17/2020  Component Date Value Ref Range Status  . SARS Coronavirus 2 by RT PCR 08/16/2020 NEGATIVE  NEGATIVE Final   Comment: (NOTE) SARS-CoV-2 target nucleic acids are NOT DETECTED.  The SARS-CoV-2 RNA is generally detectable in upper respiratoy specimens during the acute phase of infection. The lowest concentration of SARS-CoV-2 viral copies this assay can detect is 131 copies/mL. A negative result does not preclude SARS-Cov-2 infection and should not be used as the sole basis for treatment or other patient management decisions. A negative result may occur with  improper specimen collection/handling, submission of specimen other than nasopharyngeal swab, presence of viral mutation(s) within the areas targeted by this assay, and inadequate number of viral copies (<131 copies/mL). A negative result must be combined with clinical observations, patient history, and epidemiological information. The expected result is Negative.  Fact Sheet for Patients:  https://www.moore.com/  Fact Sheet for Healthcare Providers:  https://www.young.biz/  This test is no                          t yet approved or cleared by  the [place] FDA and  has been authorized for detection and/or diagnosis of SARS-CoV-2 by FDA under an Emergency Use Authorization (EUA). This EUA will remain  in effect (meaning this test can be used) for the duration of the COVID-19 declaration under Section 564(b)(1) of the Act, 21 U.S.C. section 360bbb-3(b)(1), unless the authorization is terminated or revoked sooner.    . Influenza A by PCR 08/16/2020 NEGATIVE  NEGATIVE Final  . Influenza B by PCR 08/16/2020 NEGATIVE  NEGATIVE Final   Comment: (NOTE) The Xpert Xpress SARS-CoV-2/FLU/RSV assay is intended as an aid in  the diagnosis of influenza from Nasopharyngeal swab specimens and  should not be used as a sole basis for treatment. Nasal washings and  aspirates are unacceptable for Xpert Xpress SARS-CoV-2/FLU/RSV  testing.  Fact Sheet for Patients: https://www.moore.com/  Fact Sheet for Healthcare Providers: https://www.young.biz/  This test is not yet approved or cleared by the [place] FDA and  has been authorized for detection and/or diagnosis of SARS-CoV-2 by  FDA under an Emergency Use Authorization (EUA). This EUA will remain  in effect (meaning this test can be used) for the duration of the  Covid-19 declaration under Section 564(b)(1) of the Act, 21  U.S.C. section 360bbb-3(b)(1), unless the authorization is  terminated or revoked. Performed at [facility], [address], [place], [place] [address]   . Sodium 08/17/2020 140  135 - 145 mmol/L Final  . Potassium 08/17/2020 4.4  3.5 - 5.1 mmol/L Final  . Chloride 08/17/2020 105  98 - 111 mmol/L  Final  . CO2 08/17/2020 29  22 - 32 mmol/L Final  . Glucose, Bld 08/17/2020 134* 70 - 99 mg/dL Final   Glucose reference range applies only to samples taken after fasting for at least 8 hours.  . BUN 08/17/2020 13  6 - 20 mg/dL Final  . Creatinine, Ser 08/17/2020 0.73  0.44 - 1.00 mg/dL Final  . Calcium  94/17/4081 9.4  8.9 - 10.3 mg/dL Final  . Total Protein 08/17/2020 6.7  6.5 - 8.1 g/dL Final  . Albumin 44/81/8563 3.8  3.5 - 5.0 g/dL Final  . AST 14/97/0263 20  15 - 41 U/L Final  . ALT 08/17/2020 26  0 - 44 U/L Final  . Alkaline Phosphatase 08/17/2020 42  38 - 126 U/L Final  . Total Bilirubin 08/17/2020 0.5  0.3 - 1.2 mg/dL Final  . GFR, Estimated 08/17/2020 >60  >60 mL/min Final   Comment: (NOTE) Calculated using the CKD-EPI Creatinine Equation (2021)   . Anion gap 08/17/2020 6  5 - 15 Final   Performed at [facility], [address] [place], [place] [address]  . Alcohol, Ethyl (B) 08/17/2020 <10  <10 mg/dL Final   Comment: (NOTE) Lowest detectable limit for serum alcohol is 10 mg/dL.  For medical purposes only. Performed at [facility], [address] [place], [place] [address]   . Opiates 08/16/2020 NONE DETECTED  NONE DETECTED Final  . Cocaine 08/16/2020 NONE DETECTED  NONE DETECTED Final  . Benzodiazepines 08/16/2020 POSITIVE* NONE DETECTED Final  . Amphetamines 08/16/2020 NONE DETECTED  NONE DETECTED Final  . Tetrahydrocannabinol 08/16/2020 NONE DETECTED  NONE DETECTED Final  . Barbiturates 08/16/2020 NONE DETECTED  NONE DETECTED Final   Comment: (NOTE) DRUG SCREEN FOR MEDICAL PURPOSES ONLY.  IF CONFIRMATION IS NEEDED FOR ANY PURPOSE, NOTIFY LAB WITHIN 5 DAYS.  LOWEST DETECTABLE LIMITS FOR URINE DRUG SCREEN Drug Class                     Cutoff (ng/mL) Amphetamine and metabolites    1000 Barbiturate and metabolites    200 Benzodiazepine                 200 Tricyclics and metabolites     300 Opiates and metabolites        300 Cocaine and metabolites        300 THC                            50 Performed at [facility], [address], [place], [place] [address]   . WBC 08/17/2020 10.5  4.0 - 10.5 K/uL Final  . RBC 08/17/2020 4.28  3.87 - 5.11 MIL/uL Final  . Hemoglobin 08/17/2020 12.9  12.0 - 15.0  g/dL Final  . HCT 86/76/7209 41.1  36.0 - 46.0 % Final  . MCV 08/17/2020 96.0  80.0 - 100.0 fL Final  . MCH 08/17/2020 30.1  26.0 - 34.0 pg Final  . MCHC 08/17/2020 31.4  30.0 - 36.0 g/dL Final  . RDW 47/06/6282 13.5  11.5 - 15.5 % Final  . Platelets 08/17/2020 145* 150 - 400 K/uL Final  . nRBC 08/17/2020 0.0  0.0 - 0.2 % Final  . Neutrophils Relative % 08/17/2020 50  % Final  . Neutro Abs 08/17/2020 5.2  1.7 - 7.7 K/uL Final  . Lymphocytes Relative 08/17/2020 40  % Final  . Lymphs Abs 08/17/2020 4.2*  0.7 - 4.0 K/uL Final  . Monocytes Relative 08/17/2020 7  % Final  . Monocytes Absolute 08/17/2020 0.8  0.1 - 1.0 K/uL Final  . Eosinophils Relative 08/17/2020 2  % Final  . Eosinophils Absolute 08/17/2020 0.2  0.0 - 0.5 K/uL Final  . Basophils Relative 08/17/2020 1  % Final  . Basophils Absolute 08/17/2020 0.1  0.0 - 0.1 K/uL Final  . Immature Granulocytes 08/17/2020 0  % Final  . Abs Immature Granulocytes 08/17/2020 0.03  0.00 - 0.07 K/uL Final   Performed at [facility], [address], [place], [place] [address]  . I-stat hCG, quantitative 08/17/2020 <5.0  <5 mIU/mL Final  . Comment 3 08/17/2020          Final   Comment:   GEST. AGE      CONC.  (mIU/mL)   <=1 WEEK        5 - 50     2 WEEKS       50 - 500     3 WEEKS       100 - 10,000     4 WEEKS     1,000 - 30,000        FEMALE AND NON-PREGNANT FEMALE:     LESS THAN 5 mIU/mL   . Glucose-Capillary 08/16/2020 127* 70 - 99 mg/dL Final   Glucose reference range applies only to samples taken after fasting for at least 8 hours.  Admission on 07/21/2020, Discharged on 07/22/2020  Component Date Value Ref Range Status  . SARS Coronavirus 2 by RT PCR 07/21/2020 NEGATIVE  NEGATIVE Final   Comment: (NOTE) SARS-CoV-2 target nucleic acids are NOT DETECTED.  The SARS-CoV-2 RNA is generally detectable in upper respiratoy specimens during the acute phase of infection. The lowest concentration of SARS-CoV-2 viral copies  this assay can detect is 131 copies/mL. A negative result does not preclude SARS-Cov-2 infection and should not be used as the sole basis for treatment or other patient management decisions. A negative result may occur with  improper specimen collection/handling, submission of specimen other than nasopharyngeal swab, presence of viral mutation(s) within the areas targeted by this assay, and inadequate number of viral copies (<131 copies/mL). A negative result must be combined with clinical observations, patient history, and epidemiological information. The expected result is Negative.  Fact Sheet for Patients:  https://www.moore.com/  Fact Sheet for Healthcare Providers:  https://www.young.biz/  This test is no                          t yet approved or cleared by the [place] FDA and  has been authorized for detection and/or diagnosis of SARS-CoV-2 by FDA under an Emergency Use Authorization (EUA). This EUA will remain  in effect (meaning this test can be used) for the duration of the COVID-19 declaration under Section 564(b)(1) of the Act, 21 U.S.C. section 360bbb-3(b)(1), unless the authorization is terminated or revoked sooner.    . Influenza A by PCR 07/21/2020 NEGATIVE  NEGATIVE Final  . Influenza B by PCR 07/21/2020 NEGATIVE  NEGATIVE Final   Comment: (NOTE) The Xpert Xpress SARS-CoV-2/FLU/RSV assay is intended as an aid in  the diagnosis of influenza from Nasopharyngeal swab specimens and  should not be used as a sole basis for treatment. Nasal washings and  aspirates are unacceptable for Xpert Xpress SARS-CoV-2/FLU/RSV  testing.  Fact Sheet for Patients: https://www.moore.com/  Fact Sheet for Healthcare Providers: https://www.young.biz/  This test  is not yet approved or cleared by the [place] and  has been authorized for detection and/or diagnosis of SARS-CoV-2 by  FDA under  an Emergency Use Authorization (EUA). This EUA will remain  in effect (meaning this test can be used) for the duration of the  Covid-19 declaration under Section 564(b)(1) of the Act, 21  U.S.C. section 360bbb-3(b)(1), unless the authorization is  terminated or revoked. Performed at [facility], [address], [place], [place] [address]   . Sodium 07/21/2020 141  135 - 145 mmol/L Final  . Potassium 07/21/2020 3.6  3.5 - 5.1 mmol/L Final  . Chloride 07/21/2020 102  98 - 111 mmol/L Final  . CO2 07/21/2020 25  22 - 32 mmol/L Final  . Glucose, Bld 07/21/2020 125* 70 - 99 mg/dL Final   Glucose reference range applies only to samples taken after fasting for at least 8 hours.  . BUN 07/21/2020 22* 6 - 20 mg/dL Final  . Creatinine, Ser 07/21/2020 1.49* 0.44 - 1.00 mg/dL Final  . Calcium 60/45/4098 10.0  8.9 - 10.3 mg/dL Final  . Total Protein 07/21/2020 6.9  6.5 - 8.1 g/dL Final  . Albumin 11/91/4782 4.1  3.5 - 5.0 g/dL Final  . AST 95/62/1308 16  15 - 41 U/L Final  . ALT 07/21/2020 23  0 - 44 U/L Final  . Alkaline Phosphatase 07/21/2020 42  38 - 126 U/L Final  . Total Bilirubin 07/21/2020 0.9  0.3 - 1.2 mg/dL Final  . GFR, Estimated 07/21/2020 46* >60 mL/min Final   Comment: (NOTE) Calculated using the CKD-EPI Creatinine Equation (2021)   . Anion gap 07/21/2020 14  5 - 15 Final   Performed at [facility], [address] [place], [place] [address]  . Alcohol, Ethyl (B) 07/21/2020 <10  <10 mg/dL Final   Comment: (NOTE) Lowest detectable limit for serum alcohol is 10 mg/dL.  For medical purposes only. Performed at [facility], [address] [place], [place] [address]   . Opiates 07/21/2020 NONE DETECTED  NONE DETECTED Final  . Cocaine 07/21/2020 NONE DETECTED  NONE DETECTED Final  . Benzodiazepines 07/21/2020 POSITIVE* NONE DETECTED Final  . Amphetamines 07/21/2020 NONE DETECTED  NONE DETECTED Final  . Tetrahydrocannabinol  07/21/2020 NONE DETECTED  NONE DETECTED Final  . Barbiturates 07/21/2020 NONE DETECTED  NONE DETECTED Final   Comment: (NOTE) DRUG SCREEN FOR MEDICAL PURPOSES ONLY.  IF CONFIRMATION IS NEEDED FOR ANY PURPOSE, NOTIFY LAB WITHIN 5 DAYS.  LOWEST DETECTABLE LIMITS FOR URINE DRUG SCREEN Drug Class                     Cutoff (ng/mL) Amphetamine and metabolites    1000 Barbiturate and metabolites    200 Benzodiazepine                 200 Tricyclics and metabolites     300 Opiates and metabolites        300 Cocaine and metabolites        300 THC                            50 Performed at [facility], [address], [place], [place] [address]   . WBC 07/21/2020 14.3* 4.0 - 10.5 K/uL Final  . RBC 07/21/2020 4.02  3.87 - 5.11 MIL/uL Final  . Hemoglobin 07/21/2020 12.5  12.0 - 15.0 g/dL Final  .  HCT 07/21/2020 39.0  36.0 - 46.0 % Final  . MCV 07/21/2020 97.0  80.0 - 100.0 fL Final  . MCH 07/21/2020 31.1  26.0 - 34.0 pg Final  . MCHC 07/21/2020 32.1  30.0 - 36.0 g/dL Final  . RDW 16/07/9603 14.3  11.5 - 15.5 % Final  . Platelets 07/21/2020 234  150 - 400 K/uL Final  . nRBC 07/21/2020 0.0  0.0 - 0.2 % Final  . Neutrophils Relative % 07/21/2020 49  % Final  . Neutro Abs 07/21/2020 7.0  1.7 - 7.7 K/uL Final  . Lymphocytes Relative 07/21/2020 42  % Final  . Lymphs Abs 07/21/2020 6.0* 0.7 - 4.0 K/uL Final  . Monocytes Relative 07/21/2020 7  % Final  . Monocytes Absolute 07/21/2020 1.0  0.1 - 1.0 K/uL Final  . Eosinophils Relative 07/21/2020 2  % Final  . Eosinophils Absolute 07/21/2020 0.2  0.0 - 0.5 K/uL Final  . Basophils Relative 07/21/2020 0  % Final  . Basophils Absolute 07/21/2020 0.1  0.0 - 0.1 K/uL Final  . Immature Granulocytes 07/21/2020 0  % Final  . Abs Immature Granulocytes 07/21/2020 0.04  0.00 - 0.07 K/uL Final  . Reactive, Benign Lymphocytes 07/21/2020 PRESENT   Final   Performed at [facility], [address], [place], [place]  [address]  . Color, Urine 07/21/2020 AMBER* YELLOW Final   BIOCHEMICALS MAY BE AFFECTED BY COLOR  . APPearance 07/21/2020 CLOUDY* CLEAR Final  . Specific Gravity, Urine 07/21/2020 1.024  1.005 - 1.030 Final  . pH 07/21/2020 5.0  5.0 - 8.0 Final  . Glucose, UA 07/21/2020 >=500* NEGATIVE mg/dL Final  . Hgb urine dipstick 07/21/2020 NEGATIVE  NEGATIVE Final  . Bilirubin Urine 07/21/2020 SMALL* NEGATIVE Final  . Ketones, ur 07/21/2020 5* NEGATIVE mg/dL Final  . Protein, ur 11/91/4782 100* NEGATIVE mg/dL Final  . Nitrite 95/62/1308 NEGATIVE  NEGATIVE Final  . Glori Luis 07/21/2020 SMALL* NEGATIVE Final  . RBC / HPF 07/21/2020 6-10  0 - 5 RBC/hpf Final  . WBC, UA 07/21/2020 21-50  0 - 5 WBC/hpf Final  . Bacteria, UA 07/21/2020 NONE SEEN  NONE SEEN Final  . Squamous Epithelial / LPF 07/21/2020 21-50  0 - 5 Final  . Mucus 07/21/2020 PRESENT   Final  . Hyaline Casts, UA 07/21/2020 PRESENT   Final   Performed at [facility], [address], [place], [place] [address]  . Preg Test, Ur 07/22/2020 NEGATIVE  NEGATIVE Final   Comment:        THE SENSITIVITY OF THIS METHODOLOGY IS >20 mIU/mL. Performed at [facility], [address], [place], [place] [address]     Allergies: Bupropion, Other, Effexor [venlafaxine hydrochloride], Geodon [ziprasidone hydrochloride], Ibuprofen, and Tegretol [carbamazepine]  PTA Medications: (Not in a hospital admission)   Medical Decision Making      Recommendations  Based on my evaluation the patient does not appear to have an emergency medical condition. Admitted to [facility] for further observation and stabilization.   Loletta Parish, NP 10/23/20  2:06 PM

## 2020-10-23 NOTE — ED Notes (Signed)
Earl Lites, mom, wants an update on her daughter. [phone]

## 2020-10-23 NOTE — ED Notes (Signed)
Patient labile, needing and receiving encouragement and support from staff. Some negative &/or hyperreligious comments to other patients, redirected appropriately.

## 2020-10-23 NOTE — ED Notes (Signed)
Given dinner

## 2020-10-23 NOTE — ED Triage Notes (Addendum)
Pt. Called EMS originally for a panic attack but then stated " her husband is going to leave her, and she cant get into her phone" . " I might as well die". " Patient admits to self harm by biting herself on her arms.

## 2020-10-23 NOTE — ED Notes (Signed)
[phone] Appointed Guardian  Sander Radon

## 2020-10-23 NOTE — ED Notes (Addendum)
Pt is a direct admit from [facility], Pt alert and oriented  Pt denies SI/HI, A/VH, and any pain. Education, support, reassurance, and encouragement provided, Pt's belongings in locker. Pt denies any concerns at this time, and verbally contracts for safety. Pt ambulating on the unit with no issues. Pt remains safe on the unit.

## 2020-10-23 NOTE — ED Provider Notes (Signed)
[facility]-EMERGENCY DEPT [facility] Emergency Department Provider Note MRN:  [id number]  Arrival date & time: 10/23/20     Chief Complaint   Panic Attack (Depression ) and Suicidal   History of Present Illness   Latosha Gaylord is a 35 y.o. year-old female with a history of diabetes, bipolar disorder presenting to the ED with chief complaint of suicidal ideation.  Patient endorsing continued panic attacks, she feels depressed, she is suicidal.  She is doing self harming behaviors such as biting her arms.  She is having thoughts of wanting to overdose on her home medications and kill herself.  Review of Systems  A complete 10 system review of systems was obtained and all systems are negative except as noted in the HPI and PMH.   Patient's Health History    Past Medical History:  Diagnosis Date  . Bipolar affective (HCC)   . Diabetes mellitus   . High cholesterol   . Hypertension   . Sleep apnea     Past Surgical History:  Procedure Laterality Date  . TONSILLECTOMY      No family history on file.  Social History   Socioeconomic History  . Marital status: Married    Spouse name: Not on file  . Number of children: Not on file  . Years of education: Not on file  . Highest education level: Not on file  Occupational History  . Not on file  Tobacco Use  . Smoking status: Current Every Day Smoker    Packs/day: 1.00    Types: Cigarettes  . Smokeless tobacco: Never Used  Substance and Sexual Activity  . Alcohol use: No  . Drug use: Yes  . Sexual activity: Not Currently  Other Topics Concern  . Not on file  Social History Narrative  . Not on file   Social Determinants of Health   Financial Resource Strain: Not on file  Food Insecurity: Not on file  Transportation Needs: Not on file  Physical Activity: Not on file  Stress: Not on file  Social Connections: Not on file  Intimate Partner Violence: Not on file     Physical Exam   Vitals:   10/23/20 0345 10/23/20  0352  BP: (!) 160/101   Pulse: (!) 103   Resp: 18   Temp: 98.2 F (36.8 C)   SpO2: 99% 99%    CONSTITUTIONAL:   Chronically ill-appearing, NAD NEURO:  Alert and oriented x 3, no focal deficits EYES:  eyes equal and reactive ENT/NECK:  no LAD, no JVD CARDIO: Regular rate, well-perfused, normal S1 and S2 PULM:  CTAB no wheezing or rhonchi GI/GU:  normal bowel sounds, non-distended, non-tender MSK/SPINE:  No gross deformities, no edema SKIN:  no rash, atraumatic PSYCH:  Appropriate speech and behavior  *Additional and/or pertinent findings included in MDM below  Diagnostic and Interventional Summary    EKG Interpretation  Date/Time:    Ventricular Rate:    PR Interval:    QRS Duration:   QT Interval:    QTC Calculation:   R Axis:     Text Interpretation:        Labs Reviewed  COMPREHENSIVE METABOLIC PANEL - Abnormal; Notable for the following components:      Result Value   Glucose, Bld 126 (*)    All other components within normal limits  SALICYLATE LEVEL - Abnormal; Notable for the following components:   Salicylate Lvl <7.0 (*)    All other components within normal limits  ACETAMINOPHEN LEVEL - Abnormal;  Notable for the following components:   Acetaminophen (Tylenol), Serum <10 (*)    All other components within normal limits  CBC - Abnormal; Notable for the following components:   WBC 15.5 (*)    Hemoglobin 15.3 (*)    HCT 46.4 (*)    All other components within normal limits  RESP PANEL BY RT-PCR (FLU A&B, COVID) ARPGX2  ETHANOL  RAPID URINE DRUG SCREEN, HOSP PERFORMED  I-STAT BETA HCG BLOOD, ED ([facility], [facility], [facility] ONLY)    No orders to display    Medications - No data to display   Procedures  /  Critical Care Procedures  ED Course and Medical Decision Making  I have reviewed the triage vital signs, the nursing notes, and pertinent available records from the EMR.  Listed above are laboratory and imaging tests that I personally ordered, reviewed, and  interpreted and then considered in my medical decision making (see below for details).  Recent psych admission and discharge, but now here with suicidal ideation.  Reconsulting TTS, awaiting medical clearance with labs.     Patient is medically cleared, signed out to default provider.  Here voluntarily.  IVC paperwork not felt to be indicated at this time.  Elmer Sow. Pilar Plate, MD [facility] Emergency Medicine [facility] [email or web address]  Final Clinical Impressions(s) / ED Diagnoses     ICD-10-CM   1. Suicidal ideation  R45.851   2. Depression, unspecified depression type  F32.A     ED Discharge Orders    None       Discharge Instructions Discussed with and Provided to Patient:   Discharge Instructions   None       Sabas Sous, MD 10/23/20 [phone]

## 2020-10-23 NOTE — BH Assessment (Signed)
Tele Assessment Note   Patient Name: Jessica Rios MRN: [id number] Referring Physician: EDP Location of Patient: [facility] Location of Provider: Behavioral Health TTS Department  Jessica Rios is a 35 y.o. female who presented to [facility] via EMS after calling 911 with complaint of suicidal ideation, despondency, hallucination, and other symptoms.  Pt lives in a hotel in [place], and she has been separated from her husband since August 2021.  Pt received outpatient psychiatric services through [facility], but she stated that she is not compliant with medication, and she has not seen her physician for three months.  Pt was recently discharged from [facility] where she spent about a month in custody after assaulting a Emergency planning/management officer.  Per history, Pt is known for aggression and communicating threats.  Pt was last assessed by TTS on 10/20/20.  Per history, she has a diagnosis of Schizoaffective Disorder, Bipolar I type and BPD.  Pt reported that she is suicidal due to extreme isolation and loneliness, and that she plans on overdosing.  Pt stated that she has attempted suicide ''50 to 60 times'' before.  ''I'm going to take all my pills, go outside, and fall asleep in the snow.''  In addition to suicidal ideation with plan, Pt endorsed persistent and unremitting despondency, tearfulness, isolation, worthlessness and hopelessness, weight gain (7 lbs), poor sleep (no sleep over last 48 hours).  Pt also endorsed auditory hallucination (hearing a man's voice) and visual hallucination (seeing flashes of light).  Pt also endorsed a history of self-harm -- she said that she bites and burns herself.  Pt denied substance use and alcohol concerns (last use of cocaine was November 2021).  Pt endorsed several stressors:  She is separated from her husband, she is on disability due to physical health (overweight, cannot stand on feet for long periods of time), chronic homelessness.  Per notes, Pt has a  history of communicating threats and physical aggression.   During assessment, Pt presented as alert and oriented.  She had good eye contact and was cooperative.  Pt was dressed in scrubs, and she appeared appropriately groomed.  Pt's demeanor was tearful.  Pt's mood and affect were depressed.  Pt's speech was normal in rate, rhythm, and volume.  Thought processes were within normal range, and thought content was logical and goal-oriented.  There was no evidence of delusion.  Pt's endorsed hallucination.  Memory and concentration were fair.  Insight, judgment, and impulse control were poor.  Spoke with Pt's legal guardian, Jessica Rios with [facility] DSS [phone]) who stated that she would welcome Pt's transfer to [facility] to restart meds and for overnight obs.  The plan is for Pt to be discharged in AM and for client to see her NP at [facility] this coming Thursday.  Consulted with Maxie Barb, NP, who determined that Pt is suitable for transfer to [facility]  Diagnosis: Schizoaffective Disorder, Bipolar Depressed, Severe w/psychotic features  Past Medical History:  Past Medical History:  Diagnosis Date  . Bipolar affective (HCC)   . Diabetes mellitus   . High cholesterol   . Hypertension   . Sleep apnea     Past Surgical History:  Procedure Laterality Date  . TONSILLECTOMY      Family History: No family history on file.  Social History:  reports that she has been smoking cigarettes. She has been smoking about 1.00 pack per day. She has never used smokeless tobacco. She reports current drug use. She reports that she does  not drink alcohol.  Additional Social History:  Alcohol / Drug Use Pain Medications: Please see MAR Prescriptions: Please see MAR -- Pt stated that she is not compliant Over the Counter: Please see MAR History of alcohol / drug use?: No history of alcohol / drug abuse  CIWA: CIWA-Ar BP: (!) 160/101 Pulse Rate: (!) 103 COWS:    Allergies:   Allergies  Allergen Reactions  . Bupropion Hives  . Other Anaphylaxis    Grape juice  . Effexor [Venlafaxine Hydrochloride] Anxiety  . Geodon [Ziprasidone Hydrochloride] Rash  . Tegretol [Carbamazepine] Anxiety    Home Medications: (Not in a hospital admission)   OB/GYN Status:  No LMP recorded. (Menstrual status: IUD).  General Assessment Data Location of Assessment: [facility] Date Telepsych consult ordered in [facility]: 10/23/20                                                 Advance Directives (For Healthcare) Does Patient Have a Medical Advance Directive?: No Would patient like information on creating a medical advance directive?: No - Patient declined          Disposition:     This service was provided via telemedicine using a 2-way, interactive audio and video technology.  Names of all persons participating in this telemedicine service and their role in this encounter. Name: [facility] Role: Patient  Name: Jessica Rios Role: [facility] DSS          Dorris Fetch Markail Diekman 10/23/2020 8:06 AM

## 2020-10-23 NOTE — ED Notes (Signed)
Locker #[id number]  

## 2020-10-23 NOTE — ED Provider Notes (Signed)
BH team has assessed and indicates transfer to [facility]/Provider is [facility].  Pt request med for anxiety, notes hx same.  Ativan 1 mg po.  Pt is awake and alert, and appears in no acute distress.  Pt currently appears stable for transport to [facility].     Cathren Laine, MD 10/23/20 1158

## 2020-10-24 ENCOUNTER — Other Ambulatory Visit: Payer: Self-pay | Admitting: Psychiatry

## 2020-10-24 ENCOUNTER — Telehealth (HOSPITAL_COMMUNITY): Payer: Self-pay | Admitting: Physician Assistant

## 2020-10-24 DIAGNOSIS — F250 Schizoaffective disorder, bipolar type: Secondary | ICD-10-CM

## 2020-10-24 LAB — GLUCOSE, CAPILLARY: Glucose-Capillary: 144 mg/dL — ABNORMAL HIGH (ref 70–99)

## 2020-10-24 MED ORDER — ATORVASTATIN CALCIUM 20 MG PO TABS: 20.0000 mg | ORAL_TABLET | Freq: Every day | ORAL | 0 refills | Status: AC

## 2020-10-24 MED ORDER — METFORMIN HCL ER 500 MG PO TB24: 500.0000 mg | ORAL_TABLET | Freq: Two times a day (BID) | ORAL | 0 refills | Status: AC

## 2020-10-24 MED ORDER — BENZTROPINE MESYLATE 0.5 MG PO TABS: 0.5000 mg | ORAL_TABLET | Freq: Two times a day (BID) | ORAL | 0 refills | Status: DC

## 2020-10-24 MED ORDER — DAPAGLIFLOZIN PROPANEDIOL 5 MG PO TABS: 5.0000 mg | ORAL_TABLET | Freq: Every day | ORAL | 0 refills | Status: AC

## 2020-10-24 MED ORDER — LISINOPRIL 20 MG PO TABS: 20.0000 mg | ORAL_TABLET | Freq: Every day | ORAL | 0 refills | Status: AC

## 2020-10-24 MED ORDER — HALOPERIDOL 10 MG PO TABS: 10.0000 mg | ORAL_TABLET | Freq: Every day | ORAL | 0 refills | Status: DC

## 2020-10-24 NOTE — Discharge Instructions (Addendum)

## 2020-10-24 NOTE — Progress Notes (Signed)
Pt has been psychiatrically cleared. Outpatient MH resources have been placed in her AVS.   Wells Guiles, MSW, LCSW, LCAS Clinical Social Worker II Disposition CSW [phone]

## 2020-10-24 NOTE — ED Notes (Signed)
Pt walked to the nurses desk and mumbled to the staff. Another nurse questioned Pt what did she say. Before Pt replied she fell to the floor. Several staff members came to assess. VS and CBG taken. Dr. Bronwen Betters informed and also came to assess the Pt. Pt assisted to a sitting position after denying any pain or head injury. Pt able to stand on her own and walk back to her bed w/o assistance from the staff or injury. Shortly later on, Pt up and ambulating across the room to the water fountain and looking out of the window. Pt assisted w/putting on another pair of socks prior to returning to her bed. Will continue to monitor the Pt.

## 2020-10-24 NOTE — Telephone Encounter (Signed)
Care Management - Follow Up [facility] Discharges   Writer attempted to make contact with patient's legal guardian  today and was unsuccessful.  Writer was able to leave a HIPPA compliant voice message with Pt's legal guardian, Jessica Rios with [facility] DSS [phone]) and will await callback.
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
## 2020-10-24 NOTE — Telephone Encounter (Signed)
Care Management - Follow Up [facility] Discharges   Writer attempted to make contact with patient today and was unsuccessful.  Writer was able to leave a HIPPA compliant voice message and will await callback from patient's legal guardian, Jessica Rios with [facility] [phone]).

## 2020-10-24 NOTE — ED Notes (Signed)
Pt eating breakfast, accepted morning meds w/o difficulty. No distress observed. Safety maintained and will continue to monitor.

## 2020-10-24 NOTE — ED Notes (Signed)
Discharge instructions provided and Pt stated understanding. Personal belongings returned from locker. Pt alert, orient and ambulatory. Pt escorted to the front lobby to meet guardian. Safety maintained.

## 2020-10-25 ENCOUNTER — Other Ambulatory Visit: Payer: Self-pay

## 2020-10-25 ENCOUNTER — Ambulatory Visit (HOSPITAL_COMMUNITY)
Admission: EM | Admit: 2020-10-25 | Discharge: 2020-10-25 | Disposition: A | Discharge status: Home / Self Care | Payer: Medicare Other | Attending: Registered Nurse | Admitting: Registered Nurse

## 2020-10-25 ENCOUNTER — Encounter (HOSPITAL_COMMUNITY): Payer: Self-pay | Admitting: Registered Nurse

## 2020-10-25 DIAGNOSIS — F918 Other conduct disorders: Secondary | ICD-10-CM | POA: Diagnosis not present

## 2020-10-25 DIAGNOSIS — R4588 Nonsuicidal self-harm: Secondary | ICD-10-CM | POA: Diagnosis not present

## 2020-10-25 DIAGNOSIS — F250 Schizoaffective disorder, bipolar type: Secondary | ICD-10-CM | POA: Diagnosis present

## 2020-10-25 DIAGNOSIS — Z7289 Other problems related to lifestyle: Secondary | ICD-10-CM

## 2020-10-25 DIAGNOSIS — R4689 Other symptoms and signs involving appearance and behavior: Secondary | ICD-10-CM | POA: Diagnosis not present
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
## 2020-10-25 NOTE — Discharge Instructions (Signed)
Keep current appointment with psychiatric provider for Thursday 10/27/20

## 2020-10-25 NOTE — Progress Notes (Signed)
Jessica Rios received her AVS, questions were answered and [facility] responded to take her to her designation.
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
## 2020-10-26 ENCOUNTER — Other Ambulatory Visit: Payer: Self-pay

## 2020-10-26 ENCOUNTER — Ambulatory Visit (HOSPITAL_COMMUNITY)
Admission: EM | Admit: 2020-10-26 | Discharge: 2020-10-26 | Disposition: A | Discharge status: Home / Self Care | Payer: Medicare Other | Attending: Psychiatry | Admitting: Psychiatry

## 2020-10-26 DIAGNOSIS — F603 Borderline personality disorder: Secondary | ICD-10-CM | POA: Diagnosis not present

## 2020-10-26 DIAGNOSIS — F4323 Adjustment disorder with mixed anxiety and depressed mood: Secondary | ICD-10-CM | POA: Insufficient documentation

## 2020-10-26 DIAGNOSIS — F250 Schizoaffective disorder, bipolar type: Secondary | ICD-10-CM | POA: Diagnosis not present
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
## 2020-10-26 NOTE — Discharge Instructions (Addendum)

## 2020-10-26 NOTE — ED Notes (Signed)
Patient A&O x 4, ambulatory. Patient denied SI/HI, A/VH upon discharge. Patient verbalized understanding of all discharge instructions explained by staff, to include follow up care and safety plan. Patient escorted to lobby via staff for transport to destination to [facility]. Safety maintained.

## 2020-10-27 ENCOUNTER — Emergency Department (HOSPITAL_COMMUNITY)
Admission: EM | Admit: 2020-10-27 | Discharge: 2020-10-27 | Disposition: A | Discharge status: Home / Self Care | Payer: Medicare Other | Attending: Emergency Medicine | Admitting: Emergency Medicine

## 2020-10-27 ENCOUNTER — Other Ambulatory Visit: Payer: Self-pay

## 2020-10-27 ENCOUNTER — Encounter (HOSPITAL_COMMUNITY): Payer: Self-pay

## 2020-10-27 ENCOUNTER — Emergency Department (HOSPITAL_COMMUNITY)
Admission: EM | Admit: 2020-10-27 | Discharge: 2020-10-27 | Disposition: A | Discharge status: Home / Self Care | Payer: Medicare Other | Source: Home / Self Care | Attending: Emergency Medicine | Admitting: Emergency Medicine

## 2020-10-27 DIAGNOSIS — Z046 Encounter for general psychiatric examination, requested by authority: Secondary | ICD-10-CM | POA: Insufficient documentation

## 2020-10-27 DIAGNOSIS — E119 Type 2 diabetes mellitus without complications: Secondary | ICD-10-CM | POA: Insufficient documentation

## 2020-10-27 DIAGNOSIS — F22 Delusional disorders: Secondary | ICD-10-CM | POA: Insufficient documentation

## 2020-10-27 DIAGNOSIS — F17210 Nicotine dependence, cigarettes, uncomplicated: Secondary | ICD-10-CM | POA: Insufficient documentation

## 2020-10-27 DIAGNOSIS — Z20822 Contact with and (suspected) exposure to covid-19: Secondary | ICD-10-CM | POA: Insufficient documentation

## 2020-10-27 DIAGNOSIS — Z7984 Long term (current) use of oral hypoglycemic drugs: Secondary | ICD-10-CM | POA: Insufficient documentation

## 2020-10-27 DIAGNOSIS — Z79899 Other long term (current) drug therapy: Secondary | ICD-10-CM | POA: Insufficient documentation

## 2020-10-27 DIAGNOSIS — M545 Low back pain, unspecified: Secondary | ICD-10-CM | POA: Diagnosis present

## 2020-10-27 DIAGNOSIS — R45851 Suicidal ideations: Secondary | ICD-10-CM | POA: Insufficient documentation

## 2020-10-27 DIAGNOSIS — E1169 Type 2 diabetes mellitus with other specified complication: Secondary | ICD-10-CM | POA: Insufficient documentation

## 2020-10-27 DIAGNOSIS — Y92480 Sidewalk as the place of occurrence of the external cause: Secondary | ICD-10-CM | POA: Insufficient documentation

## 2020-10-27 DIAGNOSIS — I10 Essential (primary) hypertension: Secondary | ICD-10-CM | POA: Insufficient documentation

## 2020-10-27 DIAGNOSIS — E785 Hyperlipidemia, unspecified: Secondary | ICD-10-CM | POA: Insufficient documentation

## 2020-10-27 DIAGNOSIS — R451 Restlessness and agitation: Secondary | ICD-10-CM

## 2020-10-27 LAB — SARS CORONAVIRUS 2 BY RT PCR (HOSPITAL ORDER, PERFORMED IN ~~LOC~~ HOSPITAL LAB): SARS Coronavirus 2: NEGATIVE

## 2020-10-27 LAB — CBC WITH DIFFERENTIAL/PLATELET
Abs Immature Granulocytes: 0.03 10*3/uL (ref 0.00–0.07)
Basophils Absolute: 0 10*3/uL (ref 0.0–0.1)
Basophils Relative: 0 %
Eosinophils Absolute: 0.1 10*3/uL (ref 0.0–0.5)
Eosinophils Relative: 1 %
HCT: 43.5 % (ref 36.0–46.0)
Hemoglobin: 14.1 g/dL (ref 12.0–15.0)
Immature Granulocytes: 0 %
Lymphocytes Relative: 25 %
Lymphs Abs: 2.9 10*3/uL (ref 0.7–4.0)
MCH: 30.4 pg (ref 26.0–34.0)
MCHC: 32.4 g/dL (ref 30.0–36.0)
MCV: 93.8 fL (ref 80.0–100.0)
Monocytes Absolute: 0.8 10*3/uL (ref 0.1–1.0)
Monocytes Relative: 7 %
Neutro Abs: 7.8 10*3/uL — ABNORMAL HIGH (ref 1.7–7.7)
Neutrophils Relative %: 67 %
Platelets: 246 10*3/uL (ref 150–400)
RBC: 4.64 MIL/uL (ref 3.87–5.11)
RDW: 12.5 % (ref 11.5–15.5)
WBC: 11.7 10*3/uL — ABNORMAL HIGH (ref 4.0–10.5)
nRBC: 0 % (ref 0.0–0.2)

## 2020-10-27 LAB — COMPREHENSIVE METABOLIC PANEL
ALT: 18 U/L (ref 0–44)
AST: 20 U/L (ref 15–41)
Albumin: 4.2 g/dL (ref 3.5–5.0)
Alkaline Phosphatase: 48 U/L (ref 38–126)
Anion gap: 12 (ref 5–15)
BUN: 17 mg/dL (ref 6–20)
CO2: 22 mmol/L (ref 22–32)
Calcium: 9.5 mg/dL (ref 8.9–10.3)
Chloride: 107 mmol/L (ref 98–111)
Creatinine, Ser: 0.97 mg/dL (ref 0.44–1.00)
GFR, Estimated: >60 mL/min (ref >60)
Glucose, Bld: 117 mg/dL — ABNORMAL HIGH (ref 70–99)
Potassium: 3.4 mmol/L — ABNORMAL LOW (ref 3.5–5.1)
Sodium: 141 mmol/L (ref 135–145)
Total Bilirubin: 1.2 mg/dL (ref 0.3–1.2)
Total Protein: 7 g/dL (ref 6.5–8.1)

## 2020-10-27 LAB — RAPID URINE DRUG SCREEN, HOSP PERFORMED
Amphetamines: NOT DETECTED
Barbiturates: NOT DETECTED
Benzodiazepines: NOT DETECTED
Cocaine: NOT DETECTED
Opiates: NOT DETECTED
Tetrahydrocannabinol: NOT DETECTED

## 2020-10-27 LAB — CBG MONITORING, ED: Glucose-Capillary: 141 mg/dL — ABNORMAL HIGH (ref 70–99)

## 2020-10-27 LAB — I-STAT BETA HCG BLOOD, ED (MC, WL, AP ONLY): I-stat hCG, quantitative: <5 m[IU]/mL (ref <5)

## 2020-10-27 LAB — ETHANOL: Alcohol, Ethyl (B): <10 mg/dL (ref <10)

## 2020-10-27 MED ORDER — OLANZAPINE 10 MG PO TBDP: 10.0000 mg | ORAL_TABLET | Freq: Once | ORAL | Status: DC

## 2020-10-27 MED ORDER — IBUPROFEN 800 MG PO TABS
800.0000 mg | ORAL_TABLET | Freq: Once | ORAL | Status: AC
  Administered 2020-10-27: 800 mg via ORAL
  Filled 2020-10-27: qty 1

## 2020-10-27 MED ORDER — ATORVASTATIN CALCIUM 10 MG PO TABS
20.0000 mg | ORAL_TABLET | Freq: Every day | ORAL | Status: DC
  Administered 2020-10-27: 20 mg via ORAL
  Filled 2020-10-27: qty 2

## 2020-10-27 MED ORDER — HALOPERIDOL 5 MG PO TABS
10.0000 mg | ORAL_TABLET | Freq: Every day | ORAL | Status: DC
  Administered 2020-10-27: 10 mg via ORAL
  Filled 2020-10-27: qty 2

## 2020-10-27 MED ORDER — LIDOCAINE 5 % EX PTCH
1.0000 | MEDICATED_PATCH | CUTANEOUS | Status: DC
  Administered 2020-10-27: 1 via TRANSDERMAL
  Filled 2020-10-27: qty 1

## 2020-10-27 MED ORDER — LISINOPRIL 20 MG PO TABS
20.0000 mg | ORAL_TABLET | Freq: Every day | ORAL | Status: DC
  Administered 2020-10-27: 20 mg via ORAL
  Filled 2020-10-27: qty 1

## 2020-10-27 MED ORDER — LORAZEPAM 1 MG PO TABS
2.0000 mg | ORAL_TABLET | Freq: Once | ORAL | Status: AC
  Administered 2020-10-27: 2 mg via ORAL
  Filled 2020-10-27: qty 2

## 2020-10-27 MED ORDER — LINAGLIPTIN 5 MG PO TABS
5.0000 mg | ORAL_TABLET | Freq: Every day | ORAL | Status: DC
Start: 2020-10-27 — End: 2020-10-27

## 2020-10-27 MED ORDER — LORAZEPAM 0.5 MG PO TABS
0.5000 mg | ORAL_TABLET | Freq: Three times a day (TID) | ORAL | Status: DC | PRN
  Administered 2020-10-27: 0.5 mg via ORAL
  Filled 2020-10-27: qty 1

## 2020-10-27 MED ORDER — ACETAMINOPHEN 325 MG PO TABS: 650.0000 mg | ORAL_TABLET | Freq: Four times a day (QID) | ORAL | Status: DC | PRN

## 2020-10-27 MED ORDER — STERILE WATER FOR INJECTION IJ SOLN
INTRAMUSCULAR | Status: AC
  Filled 2020-10-27: qty 10

## 2020-10-27 MED ORDER — TRAZODONE HCL 50 MG PO TABS
375.0000 mg | ORAL_TABLET | Freq: Every day | ORAL | Status: DC
Start: 2020-10-27 — End: 2020-10-27

## 2020-10-27 MED ORDER — DAPAGLIFLOZIN PROPANEDIOL 5 MG PO TABS: 5.0000 mg | ORAL_TABLET | Freq: Every day | ORAL | Status: DC

## 2020-10-27 MED ORDER — ZIPRASIDONE MESYLATE 20 MG IM SOLR
INTRAMUSCULAR | Status: AC
  Filled 2020-10-27: qty 20

## 2020-10-27 MED ORDER — BENZTROPINE MESYLATE 0.5 MG PO TABS
0.5000 mg | ORAL_TABLET | Freq: Two times a day (BID) | ORAL | Status: DC
  Administered 2020-10-27: 0.5 mg via ORAL
  Filled 2020-10-27: qty 1

## 2020-10-27 MED ORDER — METFORMIN HCL ER 500 MG PO TB24: 500.0000 mg | ORAL_TABLET | Freq: Two times a day (BID) | ORAL | Status: DC

## 2020-10-27 NOTE — ED Triage Notes (Signed)
Per EMS, pt endorsing SI. Pt has hx of bipolar, was at [facility] health earlier today, pt now hysterical and endorsing SI

## 2020-10-27 NOTE — ED Notes (Signed)
Called [place] [employer] to transport patient to [facility].

## 2020-10-27 NOTE — ED Notes (Addendum)
Brought pt sandwich, applesauce and drink. Pt calm and cooperative.

## 2020-10-27 NOTE — ED Notes (Signed)
Pts belongs are in 2 huge clear bags tied up and was placed back in [facility] in the storage room. Pts belongings are labled

## 2020-10-27 NOTE — ED Notes (Signed)
Pt screaming and crying, not following directions. MD entered room, spoke with patient. Pt became calm speaking w MD, pt in agreement with plan for calming down, labs, medications and speaking w someone from [facility]. Will obtain labs, medicate, change pt into scrubs and bring pt food shortly.

## 2020-10-27 NOTE — BH Assessment (Addendum)
[facility] Assessment Progress Note  Per Nelly Rout, MD, this voluntary pt does not require psychiatric hospitalization at this time.  Pt is psychiatrically cleared.  Pt is under guardianship of [name] [phone]) with [facility] DSS.  At 10:25 I spoke to her.  She reports that she has made arrangements for living accommodations and food for pt to get her through the upcoming weekend.  Thereafter, approval can only be obtained for two days at a time, but [name] will continue to obtain this.  She reports that pt has a doctor's appointment later today, and she will come to [facility] to pick pt up prior to appointed time.  She will call me back with a specific time.  I noted to [name] that pt's EPIC record only has a letter of interim guardianship.  She agrees to find durable letter of guardianship and to fax it to me at [phone].  Discharge instructions advise pt to continue treatment with [facility]  EDP Lorre Nick, MD and pt's nurses, Abby and Stacey Street, have been notified.  [name], MA Triage Specialist [phone]   Addendum:  On this day, Friday, 10/28/2020, this writer received updated letter of guardianship for this pt.  I will route it to Medical Records.  [name], [place] [facility] Coordinator [phone]

## 2020-10-27 NOTE — ED Notes (Signed)
Patient very emotional because her mom and dad are not answering her calls.  Medications that are ordered given.  Asked patient if she was hungry and wanted something to eat, she stated she had already eaten.  When I left the room patient was resting with eyes closed.

## 2020-10-27 NOTE — ED Notes (Signed)
Patient resting comfortably
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
## 2020-10-27 NOTE — ED Provider Notes (Signed)
[facility]-EMERGENCY DEPT Provider Note   CSN: [id number] Arrival date & time: 10/27/20  0751     History Chief Complaint  Patient presents with  . Psychiatric Evaluation    Jessica Rios is a 35 y.o. female.  35 year old female with history bipolar disorder and multiple ED visits presents with suicidal ideations as well as bizarre behavior.  Patient was seen at the behavioral health urgent care center yesterday and psychiatrically cleared.  Was seen in the ED just a few hours ago for back pain.  Patient has developed religious delusions.  States she wants to harm her self would not give any clear plan.  Patient is speaking about the antichrist.        Past Medical History:  Diagnosis Date  . Bipolar affective (HCC)   . Diabetes mellitus   . High cholesterol   . Hypertension   . Sleep apnea     Patient Active Problem List   Diagnosis Date Noted  . Schizoaffective disorder, bipolar type (HCC)   . Aggressive behavior   . Self-injurious behavior   . Bipolar I disorder, most recent episode depressed (HCC) 03/25/2020  . Diabetes (HCC) 03/25/2020  . Essential hypertension 03/25/2020  . Obesity 03/25/2020  . Rash and nonspecific skin eruption 03/25/2020    Past Surgical History:  Procedure Laterality Date  . TONSILLECTOMY       OB History   No obstetric history on file.     No family history on file.  Social History   Tobacco Use  . Smoking status: Current Every Day Smoker    Packs/day: 1.00    Types: Cigarettes  . Smokeless tobacco: Never Used  Vaping Use  . Vaping Use: Never used  Substance Use Topics  . Alcohol use: No  . Drug use: Yes    Home Medications Prior to Admission medications   Medication Sig Start Date End Date Taking? Authorizing Provider  acetaminophen (TYLENOL) 325 MG tablet Take 650 mg by mouth every 6 (six) hours as needed for pain. 08/15/20   [provider]  atorvastatin (LIPITOR) 20 MG tablet Take 1  tablet (20 mg total) by mouth daily. 10/25/20   Estella Husk, MD  benztropine (COGENTIN) 0.5 MG tablet Take 1 tablet (0.5 mg total) by mouth 2 (two) times daily. 10/24/20 11/23/20  Estella Husk, MD  dapagliflozin propanediol (FARXIGA) 5 MG TABS tablet Take 1 tablet (5 mg total) by mouth daily. 10/25/20 11/24/20  Estella Husk, MD  haloperidol (HALDOL) 10 MG tablet Take 1 tablet (10 mg total) by mouth daily. 10/25/20   Estella Husk, MD  lisinopril (ZESTRIL) 20 MG tablet Take 1 tablet (20 mg total) by mouth daily. 10/24/20 11/23/20  Estella Husk, MD  LORazepam (ATIVAN) 0.5 MG tablet Take 0.5 mg by mouth every 8 (eight) hours as needed for anxiety.    [provider]  metFORMIN (GLUCOPHAGE-XR) 500 MG 24 hr tablet Take 1 tablet (500 mg total) by mouth 2 (two) times daily with a meal. 10/24/20 11/23/20  Estella Husk, MD  Omega-3 Fatty Acids (FISH OIL) 1200 MG CAPS Take 1,200 mg by mouth daily.    [provider]  sitaGLIPtin (JANUVIA) 100 MG tablet Take 100 mg by mouth daily.    [provider]  traZODone (DESYREL) 150 MG tablet Take 375 mg by mouth at bedtime. (2.5 Tablets)    [provider]  valACYclovir (VALTREX) 500 MG tablet Take 500 mg by mouth daily.  [provider]    Allergies    Bupropion, Other, Effexor [venlafaxine hydrochloride], Geodon [ziprasidone hydrochloride], Ibuprofen, and Tegretol [carbamazepine]  Review of Systems   Review of Systems  All other systems reviewed and are negative.   Physical Exam Updated Vital Signs BP (!) 147/79   Pulse 98   Temp 98.1 F (36.7 C) (Oral)   Resp (!) 31   Ht 1.753 m (5\' 9" )   Wt (!) 158.8 kg   SpO2 97%   BMI 51.69 kg/m   Physical Exam Vitals and nursing note reviewed.  Constitutional:      General: She is not in acute distress.    Appearance: Normal appearance. She is well-developed and well-nourished. She is not toxic-appearing.  HENT:     Head:  Normocephalic and atraumatic.  Eyes:     General: Lids are normal.     Extraocular Movements: EOM normal.     Conjunctiva/sclera: Conjunctivae normal.     Pupils: Pupils are equal, round, and reactive to light.  Neck:     Thyroid: No thyroid mass.     Trachea: No tracheal deviation.  Cardiovascular:     Rate and Rhythm: Normal rate and regular rhythm.     Heart sounds: Normal heart sounds. No murmur heard. No gallop.   Pulmonary:     Effort: Pulmonary effort is normal. No respiratory distress.     Breath sounds: Normal breath sounds. No stridor. No decreased breath sounds, wheezing, rhonchi or rales.  Abdominal:     General: Bowel sounds are normal. There is no distension.     Palpations: Abdomen is soft.     Tenderness: There is no abdominal tenderness. There is no CVA tenderness or rebound.  Musculoskeletal:        General: No tenderness or edema. Normal range of motion.     Cervical back: Normal range of motion and neck supple.  Skin:    General: Skin is warm and dry.     Findings: No abrasion or rash.  Neurological:     Mental Status: She is alert and oriented to person, place, and time.     GCS: GCS eye subscore is 4. GCS verbal subscore is 5. GCS motor subscore is 6.     Cranial Nerves: No cranial nerve deficit.     Sensory: No sensory deficit.     Deep Tendon Reflexes: Strength normal.  Psychiatric:        Attention and Perception: Attention normal.        Mood and Affect: Affect is labile, angry and inappropriate.        Speech: Speech is tangential.        Behavior: Behavior is agitated and aggressive.        Thought Content: Thought content includes suicidal ideation.     ED Results / Procedures / Treatments   Labs (all labs ordered are listed, but only abnormal results are displayed) Labs Reviewed  SARS CORONAVIRUS 2 BY RT PCR (HOSPITAL ORDER, PERFORMED IN [facility])  ETHANOL  RAPID URINE DRUG SCREEN, HOSP PERFORMED  CBC WITH  DIFFERENTIAL/PLATELET  COMPREHENSIVE METABOLIC PANEL  I-STAT BETA HCG BLOOD, ED ([facility], [facility], [facility] ONLY)    EKG None  Radiology No results found.  Procedures Procedures   Medications Ordered in ED Medications  ziprasidone (GEODON) 20 MG injection (has no administration in time range)  sterile water (preservative free) injection (has no administration in time range)  LORazepam (ATIVAN) tablet 2 mg (has no  administration in time range)  benztropine (COGENTIN) tablet 0.5 mg (has no administration in time range)  dapagliflozin propanediol (FARXIGA) tablet 5 mg (has no administration in time range)  haloperidol (HALDOL) tablet 10 mg (has no administration in time range)  lisinopril (ZESTRIL) tablet 20 mg (has no administration in time range)  metFORMIN (GLUCOPHAGE-XR) 24 hr tablet 500 mg (has no administration in time range)  linagliptin (TRADJENTA) tablet 5 mg (has no administration in time range)  traZODone (DESYREL) tablet 375 mg (has no administration in time range)  LORazepam (ATIVAN) tablet 0.5 mg (has no administration in time range)  acetaminophen (TYLENOL) tablet 650 mg (has no administration in time range)  atorvastatin (LIPITOR) tablet 20 mg (has no administration in time range)    ED Course  I have reviewed the triage vital signs and the nursing notes.  Pertinent labs & imaging results that were available during my care of the patient were reviewed by me and considered in my medical decision making (see chart for details).    MDM Rules/Calculators/A&P                           Patient initially had endorsed suicidal ideations but now denies them.  Given Ativan and does feel better. Patient seen by tTS and safe discharge has been arranged Final Clinical Impression(s) / ED Diagnoses Final diagnoses:  None    Rx / DC Orders ED Discharge Orders    None       Lorre Nick, MD 10/27/20 1148

## 2020-10-27 NOTE — ED Notes (Signed)
Patient dressed out in purple scrubs 

## 2020-10-27 NOTE — Discharge Instructions (Signed)
For your behavioral health needs, you are advised to continue treatment with [facility]:       [facility]      [address]      [place], [place] [address]      [phone]

## 2020-10-27 NOTE — ED Notes (Signed)
Emergency planning/management officer here to transport to [employer]

## 2020-10-27 NOTE — ED Triage Notes (Signed)
EMS called out d/t patient undressing [place] [facility] parking [place]. Per EMS- pt is speaking about antichrist, racial slurs, etc. Pt was combative w EMS. 5mg  haldol given. EMS unable to obtain vs
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
## 2020-10-27 NOTE — ED Notes (Signed)
Pt ride here Jessica Rios)

## 2020-10-28 ENCOUNTER — Emergency Department (HOSPITAL_COMMUNITY)
Admission: EM | Admit: 2020-10-28 | Discharge: 2020-10-28 | Disposition: A | Discharge status: Home / Self Care | Payer: Medicare Other | Attending: Emergency Medicine | Admitting: Emergency Medicine

## 2020-10-28 ENCOUNTER — Encounter (HOSPITAL_COMMUNITY): Payer: Self-pay | Admitting: Emergency Medicine

## 2020-10-28 ENCOUNTER — Other Ambulatory Visit: Payer: Self-pay

## 2020-10-28 ENCOUNTER — Telehealth (HOSPITAL_COMMUNITY): Payer: Self-pay | Admitting: Physician Assistant

## 2020-10-28 ENCOUNTER — Ambulatory Visit (INDEPENDENT_AMBULATORY_CARE_PROVIDER_SITE_OTHER)
Admission: EM | Admit: 2020-10-28 | Discharge: 2020-10-28 | Disposition: A | Discharge status: Home / Self Care | Payer: Medicare Other | Source: Home / Self Care

## 2020-10-28 ENCOUNTER — Ambulatory Visit (HOSPITAL_COMMUNITY)
Admission: EM | Admit: 2020-10-28 | Discharge: 2020-10-28 | Disposition: A | Discharge status: Home / Self Care | Payer: Medicare Other

## 2020-10-28 DIAGNOSIS — F17210 Nicotine dependence, cigarettes, uncomplicated: Secondary | ICD-10-CM | POA: Diagnosis not present

## 2020-10-28 DIAGNOSIS — Z79899 Other long term (current) drug therapy: Secondary | ICD-10-CM | POA: Insufficient documentation

## 2020-10-28 DIAGNOSIS — F250 Schizoaffective disorder, bipolar type: Secondary | ICD-10-CM

## 2020-10-28 DIAGNOSIS — I10 Essential (primary) hypertension: Secondary | ICD-10-CM | POA: Diagnosis not present

## 2020-10-28 DIAGNOSIS — F603 Borderline personality disorder: Secondary | ICD-10-CM

## 2020-10-28 DIAGNOSIS — R454 Irritability and anger: Secondary | ICD-10-CM | POA: Insufficient documentation

## 2020-10-28 DIAGNOSIS — E119 Type 2 diabetes mellitus without complications: Secondary | ICD-10-CM | POA: Diagnosis not present

## 2020-10-28 DIAGNOSIS — Z765 Malingerer [conscious simulation]: Secondary | ICD-10-CM

## 2020-10-28 DIAGNOSIS — M545 Low back pain, unspecified: Secondary | ICD-10-CM | POA: Diagnosis not present

## 2020-10-28 DIAGNOSIS — F32A Depression, unspecified: Secondary | ICD-10-CM | POA: Diagnosis not present

## 2020-10-28 DIAGNOSIS — Z7984 Long term (current) use of oral hypoglycemic drugs: Secondary | ICD-10-CM | POA: Diagnosis not present

## 2020-10-28 MED ORDER — IBUPROFEN 400 MG PO TABS
600.0000 mg | ORAL_TABLET | Freq: Once | ORAL | Status: AC
  Administered 2020-10-28: 600 mg via ORAL
  Filled 2020-10-28: qty 1
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
## 2020-10-28 NOTE — ED Provider Notes (Signed)
Behavioral Health Urgent Care Medical Screening Exam  Patient Name: Jessica Rios MRN: [id number] Date of Evaluation: 10/28/20 Chief Complaint:   Diagnosis:  Final diagnoses:  Borderline personality disorder (HCC)  Schizoaffective disorder, bipolar type (HCC)    History of Present illness: Jessica Rios is a 35 y.o. female with a history of schizoaffective disorder and borderline personality disorder who presents to [facility] voluntarily with Patent examiner. This is the patient's 5th encounter at [facility] since 10/20/2020. She has also presented to the ED multiple times. Patient On evaluation, patient immediately states "I had the police bring me here because I want to ask Jessica Rios, the security guard, out on a date for Valentine's day but I can't wait that long." Patient later asks "Is it inappropriate to hook up here." When asked for clarification, she states "I was thinking about coming up here on Valentine's Day, surprising Jessica Rios, and asking him to take me on a date. I didn't know if that was appropriate." Informed patient that was not appropriate for staff to date patient. She continued to ruminate on wanting to date Jessica Rios and states "he is a security guard, so he is strong and can protect me."   Patient reported to TTS and nursing staff that she was suicidal with thoughts of overdosing. Patient denies intent or plan to this provider but does state that the reason that she is having suicidal thoughts is because she is lonely and does not feel safe staying alone. She states " I am afraid I want wake up. I am afraid people are watching me and might break in."   Patient reports that she is staying at hotel but she does not recall the name of the hotel.   Patient reports that her guardian is Jessica Rios. TTS has attempted to contact patient's guardian to no avail.   [name] note 10/27/2020 Pt is under guardianship of Jessica Rios ([phone]) with [facility] DSS.   At 10:25 I spoke to her.  She reports that she has made arrangements for living accommodations and food for pt to get her through the upcoming weekend.  Thereafter, approval can only be obtained for two days at a time, but [name] will continue to obtain this.  She reports that pt has a doctor's appointment later today, and she will come to [facility] to pick pt up prior to appointed time.  She will call me back with a specific time.  I noted to [name] that pt's EPIC record only has a letter of interim guardianship.    Patient does not meet criteria for inpatient treatment or continuous assessment at this time.   Patient will be allowed to remain in the lobby until staff is able to make contact with her guardian and arrange transport.    Psychiatric Specialty Exam  Presentation  General Appearance:Disheveled  Eye Contact:Good  Speech:Clear and Coherent; Normal Rate  Speech Volume:Normal  Handedness:Right   Mood and Affect  Mood:Depressed  Affect:Non-Congruent; Appropriate   Thought Process  Thought Processes:Coherent  Descriptions of Associations:Intact  Orientation:Full (Time, Place and Person)  Thought Content:WDL  Hallucinations:None  Ideas of Reference:None  Suicidal Thoughts:No  Homicidal Thoughts:No   Sensorium  Memory:Immediate Good; Recent Good; Remote Good  Judgment:Intact  Insight:Present   Executive Functions  Concentration:Good  Attention Span:Good  Recall:Good  Fund of Knowledge:Good  Language:Good   Psychomotor Activity  Psychomotor Activity:Normal   Assets  Assets:Communication Skills; Desire for Improvement; Financial Resources/Insurance; Social Support   Sleep  Sleep:Fair  Number  of hours: No data recorded  Physical Exam: Physical Exam Constitutional:      General: She is not in acute distress.    Appearance: She is not ill-appearing, toxic-appearing or diaphoretic.  HENT:     Head: Normocephalic.     Right Ear: External  ear normal.     Left Ear: External ear normal.  Eyes:     Conjunctiva/sclera: Conjunctivae normal.     Pupils: Pupils are equal, round, and reactive to light.  Cardiovascular:     Rate and Rhythm: Normal rate.  Pulmonary:     Effort: Pulmonary effort is normal. No respiratory distress.  Musculoskeletal:        General: Normal range of motion.  Neurological:     General: No focal deficit present.     Mental Status: She is alert and oriented to person, place, and time.  Psychiatric:        Mood and Affect: Mood is anxious and depressed.        Thought Content: Thought content is paranoid. Thought content includes suicidal ideation. Thought content does not include homicidal ideation. Thought content does not include suicidal plan.    Review of Systems  Constitutional: Negative for chills, diaphoresis, fever, malaise/fatigue and weight loss.  HENT: Negative for congestion.   Respiratory: Negative for cough and shortness of breath.   Cardiovascular: Negative for chest pain and palpitations.  Gastrointestinal: Negative for diarrhea, nausea and vomiting.  Neurological: Negative for dizziness and seizures.  Psychiatric/Behavioral: Positive for depression, hallucinations and suicidal ideas. Negative for memory loss and substance abuse. The patient is nervous/anxious and has insomnia.   All other systems reviewed and are negative.  Blood pressure 138/76, pulse (!) 105, temperature 97.7 F (36.5 C), temperature source Oral, resp. rate 18, SpO2 98 %. There is no height or weight on file to calculate BMI.  Musculoskeletal: Strength & Muscle Tone: within normal limits Gait & Station: normal Patient leans: N/A   [facility] MSE Discharge Disposition for Follow up and Recommendations: Based on my evaluation the patient does not appear to have an emergency medical condition and can be discharged with resources and follow up care in outpatient services for Medication Management and Individual Therapy       Jackelyn Poling, NP 10/28/2020, 6:36 AM

## 2020-10-28 NOTE — Telephone Encounter (Signed)
Care Management - Follow Up [facility] Discharges   Writer attempted to make contact with patient's legal guardian today and was unsuccessful.  Writer was able to leave a HIPPA compliant voice message and will await callback.  Pt is under guardianship of Jessica Rios([id number])with [facility] DSS
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
## 2020-10-28 NOTE — Progress Notes (Signed)
The [employer] dispatcher was called to locate the name of the motel where Jessica Rios is staying. The name of the motel is [facility] at [employer]. Safe Transport was called per order.

## 2020-10-28 NOTE — Discharge Planning (Signed)
RNCM consulted regarding transportation needs for pt.  RNCM provided [facility] Safe Transport as pt has no transportation from hospital.    Pt has signed waiver on file, therefore agreeing to written terms.

## 2020-10-28 NOTE — ED Triage Notes (Addendum)
[employer] transport, pt presents with suicidal ideations, plan to overdose on pills.  Pt reports she feels  very paranoid at present, denies HI or AVH. Reports difficulty sleeping.  Pt reports she wants to ask our [employer] out on a date.

## 2020-10-28 NOTE — ED Triage Notes (Signed)
Pt brought to ED via EMS from motel with c/o back pain. Seen yesterday for same. Per EMS-- pt left because it was taking too long. EMS reports pt took antianxiety meds prior to arrival. Pt ambulatory on arrival to ED, alert and oriented x4, NAD.  EMS v/s: 130/80 80 HR 140 CBG 99% on room air

## 2020-10-28 NOTE — Social Work (Signed)
CSW met with Pt at bedside.  Pt reports anxiety about weather and other stressors. CSW and Pt discussed self-soothing methods. CSW spoke with DSS guardian Terrace Arabia via phone @ [phone] who requested assistance making follow-up appointment at [facility]. CSW made follow-up appointment for March 14 at 10:30 am.  CSW informed Pt , as well as Ms. Weeks and added info to AVS.  RN case Freight forwarder coordinated transportation back to [employer] via [employer].  CSW provided Pt with play doh as a method for anxiety soothing. Pt verbalized thanks.

## 2020-10-28 NOTE — ED Notes (Signed)
Patient belonging: a jacket and a hotel care (key) are stored in locker [phone]

## 2020-10-28 NOTE — Discharge Instructions (Addendum)
Follow up with your PCP regarding your ED visit today You can pick up Salonpas patches OTC for your pain
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
## 2020-10-28 NOTE — Discharge Instructions (Signed)
   Discharge recommendations:  Patient is to take medications as prescribed. Please see information for follow-up appointment with psychiatry and therapy. Please follow up with your primary care provider for all medical related needs.   Therapy: We recommend that patient participate in individual therapy to address mental health concerns.   Atypical antipsychotics: If you are prescribed an atypical antipsychotic, it is recommended that your height, weight, BMI, blood pressure, fasting lipid panel, and fasting blood sugar be monitored by your outpatient providers.  Safety:  The patient should abstain from use of illicit substances/drugs and abuse of any medications. If symptoms worsen or do not continue to improve or if the patient becomes actively suicidal or homicidal then it is recommended that the patient return to the closest hospital emergency department, the [facility], or call 911 for further evaluation and treatment. National Suicide Prevention Lifeline 1-800-SUICIDE or 1-800-273-8255.  

## 2020-10-28 NOTE — Discharge Instructions (Addendum)

## 2020-10-28 NOTE — ED Notes (Addendum)
Pt said last meal was few days ago.

## 2020-10-29 ENCOUNTER — Other Ambulatory Visit: Payer: Self-pay

## 2020-10-29 ENCOUNTER — Encounter (HOSPITAL_COMMUNITY): Payer: Self-pay | Admitting: Emergency Medicine

## 2020-10-29 ENCOUNTER — Emergency Department (HOSPITAL_COMMUNITY)
Admission: EM | Admit: 2020-10-29 | Discharge: 2020-10-29 | Disposition: A | Discharge status: Home / Self Care | Payer: Medicare Other | Source: Home / Self Care | Attending: Emergency Medicine | Admitting: Emergency Medicine

## 2020-10-29 ENCOUNTER — Emergency Department (HOSPITAL_COMMUNITY)
Admission: EM | Admit: 2020-10-29 | Discharge: 2020-10-29 | Disposition: A | Discharge status: Home / Self Care | Payer: Medicare Other | Attending: Emergency Medicine | Admitting: Emergency Medicine

## 2020-10-29 DIAGNOSIS — Z20822 Contact with and (suspected) exposure to covid-19: Secondary | ICD-10-CM | POA: Diagnosis not present

## 2020-10-29 DIAGNOSIS — E119 Type 2 diabetes mellitus without complications: Secondary | ICD-10-CM | POA: Diagnosis not present

## 2020-10-29 DIAGNOSIS — I10 Essential (primary) hypertension: Secondary | ICD-10-CM | POA: Insufficient documentation

## 2020-10-29 DIAGNOSIS — R002 Palpitations: Secondary | ICD-10-CM | POA: Insufficient documentation

## 2020-10-29 DIAGNOSIS — Z79899 Other long term (current) drug therapy: Secondary | ICD-10-CM | POA: Insufficient documentation

## 2020-10-29 DIAGNOSIS — Z7984 Long term (current) use of oral hypoglycemic drugs: Secondary | ICD-10-CM | POA: Insufficient documentation

## 2020-10-29 DIAGNOSIS — F17210 Nicotine dependence, cigarettes, uncomplicated: Secondary | ICD-10-CM | POA: Insufficient documentation

## 2020-10-29 DIAGNOSIS — R0789 Other chest pain: Secondary | ICD-10-CM | POA: Insufficient documentation

## 2020-10-29 DIAGNOSIS — R6889 Other general symptoms and signs: Secondary | ICD-10-CM

## 2020-10-29 DIAGNOSIS — M549 Dorsalgia, unspecified: Secondary | ICD-10-CM | POA: Insufficient documentation

## 2020-10-29 LAB — BASIC METABOLIC PANEL
Anion gap: 14 (ref 5–15)
BUN: 7 mg/dL (ref 6–20)
CO2: 22 mmol/L (ref 22–32)
Calcium: 9.2 mg/dL (ref 8.9–10.3)
Chloride: 108 mmol/L (ref 98–111)
Creatinine, Ser: 0.82 mg/dL (ref 0.44–1.00)
GFR, Estimated: >60 mL/min (ref >60)
Glucose, Bld: 142 mg/dL — ABNORMAL HIGH (ref 70–99)
Potassium: 3.7 mmol/L (ref 3.5–5.1)
Sodium: 144 mmol/L (ref 135–145)

## 2020-10-29 LAB — I-STAT BETA HCG BLOOD, ED (MC, WL, AP ONLY): I-stat hCG, quantitative: <5 m[IU]/mL (ref <5)

## 2020-10-29 LAB — POC URINE PREG, ED: Preg Test, Ur: NEGATIVE

## 2020-10-29 LAB — CBC
HCT: 41.7 % (ref 36.0–46.0)
Hemoglobin: 13.5 g/dL (ref 12.0–15.0)
MCH: 30.4 pg (ref 26.0–34.0)
MCHC: 32.4 g/dL (ref 30.0–36.0)
MCV: 93.9 fL (ref 80.0–100.0)
Platelets: 276 10*3/uL (ref 150–400)
RBC: 4.44 MIL/uL (ref 3.87–5.11)
RDW: 12.6 % (ref 11.5–15.5)
WBC: 14.8 10*3/uL — ABNORMAL HIGH (ref 4.0–10.5)
nRBC: 0 % (ref 0.0–0.2)

## 2020-10-29 LAB — SARS CORONAVIRUS 2 (TAT 6-24 HRS): SARS Coronavirus 2: NEGATIVE

## 2020-10-29 LAB — TROPONIN I (HIGH SENSITIVITY): Troponin I (High Sensitivity): 5 ng/L (ref <18)

## 2020-10-29 NOTE — ED Provider Notes (Signed)
[facility] EMERGENCY DEPARTMENT Provider Note   CSN: [id number] Arrival date & time: 10/29/20  1324     History Chief Complaint  Patient presents with  . Chest Pain    Jessica Rios is a 35 y.o. female.  HPI This adult female with multiple medical issues including schizoaffective disorder, bipolar type presents for the fifth time in the past 3 days, now with multiple concerns.  She notes that after being evaluated here earlier in the morning she returned to her hotel, but with concern for involuntary movement of both arms, chest pain, generalized discomfort she returns for evaluation. She notes that she is concerned about her continued involuntary bilateral hand motion that seems to be continuous.  However, when the patient is in the middle of a conversation or has specific questions directed to her, she is calm, with no motion at all. She offers a tangential account of other concerns, largely with positive review of systems answers. Additional history obtained on chart review, including documentation from emergency department visits over the past 2 days and behavioral health over the past 3 days. Patient's history of schizoaffective disorder, bipolar type well noted, with concern for malingering as well. Seemingly the patient has had no recent medication changes, has not been on trazodone or Ativan, both which she requests, for several months. Beyond episodic chest discomfort patient denies other focal pain.     Past Medical History:  Diagnosis Date  . Bipolar affective (HCC)   . Diabetes mellitus   . High cholesterol   . Hypertension   . Sleep apnea     Patient Active Problem List   Diagnosis Date Noted  . Schizoaffective disorder, bipolar type (HCC)   . Aggressive behavior   . Self-injurious behavior   . Bipolar I disorder, most recent episode depressed (HCC) 03/25/2020  . Diabetes (HCC) 03/25/2020  . Essential hypertension 03/25/2020  . Obesity  03/25/2020  . Rash and nonspecific skin eruption 03/25/2020    Past Surgical History:  Procedure Laterality Date  . TONSILLECTOMY       OB History   No obstetric history on file.     No family history on file.  Social History   Tobacco Use  . Smoking status: Current Every Day Smoker    Packs/day: 1.00    Types: Cigarettes  . Smokeless tobacco: Never Used  Vaping Use  . Vaping Use: Never used  Substance Use Topics  . Alcohol use: No  . Drug use: Yes    Home Medications Prior to Admission medications   Medication Sig Start Date End Date Taking? Authorizing Provider  lisinopril (ZESTRIL) 20 MG tablet Take 1 tablet (20 mg total) by mouth daily. 10/24/20 11/23/20 Yes Estella Husk, MD  metFORMIN (GLUCOPHAGE-XR) 500 MG 24 hr tablet Take 1 tablet (500 mg total) by mouth 2 (two) times daily with a meal. 10/24/20 11/23/20 Yes Estella Husk, MD  atorvastatin (LIPITOR) 20 MG tablet Take 1 tablet (20 mg total) by mouth daily. 10/25/20   Estella Husk, MD  benztropine (COGENTIN) 0.5 MG tablet Take 1 tablet (0.5 mg total) by mouth 2 (two) times daily. 10/24/20 11/23/20  Estella Husk, MD  dapagliflozin propanediol (FARXIGA) 5 MG TABS tablet Take 1 tablet (5 mg total) by mouth daily. 10/25/20 11/24/20  Estella Husk, MD  haloperidol (HALDOL) 10 MG tablet Take 1 tablet (10 mg total) by mouth daily. Patient taking differently: Take 10 mg by mouth 2 (two) times daily. 10/25/20  Estella Husk, MD  LORazepam (ATIVAN) 0.5 MG tablet Take 0.5 mg by mouth every 8 (eight) hours as needed for anxiety.    [provider]  simvastatin (ZOCOR) 80 MG tablet Take 80 mg by mouth at bedtime.    [provider]  sitaGLIPtin (JANUVIA) 100 MG tablet Take 100 mg by mouth daily.    [provider]  traZODone (DESYREL) 150 MG tablet Take 375 mg by mouth at bedtime. (2.5 Tablets)    [provider]  valACYclovir (VALTREX) 500 MG tablet Take 500  mg by mouth daily.    [provider]    Allergies    Bupropion, Other, Acetaminophen, Effexor [venlafaxine hydrochloride], Geodon [ziprasidone hydrochloride], Ibuprofen, and Tegretol [carbamazepine]  Review of Systems   Review of Systems  Unable to perform ROS: Psychiatric disorder    Physical Exam Updated Vital Signs BP (!) 109/57 (BP Location: Left Arm)   Pulse 97   Temp 98 F (36.7 C) (Oral)   Resp 17   Ht 5\' 11"  (1.803 m)   SpO2 97%   BMI 48.58 kg/m   Physical Exam Vitals and nursing note reviewed.  Constitutional:      General: She is not in acute distress.    Appearance: She is well-developed and well-nourished. She is obese. She is not ill-appearing, toxic-appearing or diaphoretic.  HENT:     Head: Normocephalic and atraumatic.  Eyes:     Extraocular Movements: EOM normal.     Conjunctiva/sclera: Conjunctivae normal.  Cardiovascular:     Rate and Rhythm: Normal rate and regular rhythm.  Pulmonary:     Effort: Pulmonary effort is normal. No respiratory distress.     Breath sounds: Normal breath sounds. No stridor.  Abdominal:     General: There is no distension.  Musculoskeletal:        General: No edema.  Skin:    General: Skin is warm and dry.  Neurological:     Mental Status: She is alert and oriented to person, place, and time.     Cranial Nerves: No cranial nerve deficit.     Comments: Face is symmetric, speech is clear, though the patient has concern of bilateral arm involuntary motion, this is inconsistent, and when she is focusing, there is no motion it is extraneous.  Psychiatric:        Attention and Perception: She is inattentive.        Mood and Affect: Mood is anxious.        Cognition and Memory: Cognition is impaired.     ED Results / Procedures / Treatments   Labs (all labs ordered are listed, but only abnormal results are displayed) Labs Reviewed  BASIC METABOLIC PANEL - Abnormal; Notable for the following components:       Result Value   Glucose, Bld 142 (*)    All other components within normal limits  CBC - Abnormal; Notable for the following components:   WBC 14.8 (*)    All other components within normal limits  I-STAT BETA HCG BLOOD, ED ([facility], [facility], [facility] ONLY)  TROPONIN I (HIGH SENSITIVITY)  TROPONIN I (HIGH SENSITIVITY)    EKG EKG Interpretation  Date/Time:  Saturday October 29 2020 13:55:33 EST Ventricular Rate:  91 PR Interval:  134 QRS Duration: 90 QT Interval:  348 QTC Calculation: 428 R Axis:   0 Text Interpretation: Sinus rhythm with frequent Premature ventricular complexes and Premature atrial complexes Minimal voltage criteria for LVH, may be normal variant (  R in aVL ) Borderline ECG Confirmed by Gerhard Munch [phone]) on 10/29/2020 4:53:00 PM   Radiology No results found.    Medications Ordered in ED Medications - No data to display  ED Course  I have reviewed the triage vital signs and the nursing notes.  Pertinent labs & imaging results that were available during my care of the patient were reviewed by me and considered in my medical decision making (see chart for details). Reviewed the patient's chart, she has been seen and evaluated here multiple times, as above including twice within the past 2 days by behavioral health, not designated as a candidate appropriate for inpatient psychiatric therapy   MDM Rules/Calculators/A&P On repeat exam patient is in no distress, continues to complain of a variety of complaints, but has no evidence for decompensated state. I had a lengthy conversation with her about her labs which are reassuring, mild leukocytosis noted. Subsequent discussed the patient with the adult guardianship office at [facility]. They are aware of the patient, note that the patient is currently being evaluated for placement, has lunging to manage as an outpatient guardians aware of the patient's discharge, will facilitate close outpatient follow-up. Seen evidence  for distress, hemodynamic instability, with reassuring labs, and similar physical exam to multiple prior studies, little suspicion for acute new pathology, some suspicion for the patient schizoaffective disorder contributing to her presentation. Final Clinical Impression(s) / ED Diagnoses Final diagnoses:  Palpitations      Gerhard Munch, MD 10/29/20 2336

## 2020-10-29 NOTE — Discharge Instructions (Signed)
Pregnancy test was negative. Can see GYN if any issues with your IUD. You will be contacted if your covid test is positive. Return here for new concerns.

## 2020-10-29 NOTE — Discharge Instructions (Addendum)
Today's evaluation has been reassuring.  It is very important that you follow-up with your physician, in particular to discuss your medication regimen.  Return here for concerning changes in your condition.

## 2020-10-29 NOTE — ED Notes (Signed)
Pt appears anxious, speaking quickly voicing concerns that she believes she may be having a baby from intercourse last night. Reports back pain and pressure in her genital area.

## 2020-10-29 NOTE — ED Triage Notes (Signed)
Pt reports severe pain to L arm, lower back, upper back, and chest that she states started this morning after being discharged from ED.  States she was here for " a pregnancy test after having sex" and was told the test was negative.
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
## 2020-10-29 NOTE — ED Triage Notes (Signed)
Pt arrived via [facility] for cc of sudden onset back pain and concern she may have COVID after speaking with a maskless person today. EMS report extensive psychiatric history with anxiety. Pt ambulated from ambulance to room.

## 2020-10-31 ENCOUNTER — Emergency Department (HOSPITAL_COMMUNITY): Payer: Medicare Other

## 2020-10-31 ENCOUNTER — Other Ambulatory Visit: Payer: Self-pay

## 2020-10-31 ENCOUNTER — Emergency Department (HOSPITAL_COMMUNITY)
Admission: EM | Admit: 2020-10-31 | Discharge: 2020-11-01 | Disposition: A | Discharge status: Home / Self Care | Payer: Medicare Other | Attending: Emergency Medicine | Admitting: Emergency Medicine

## 2020-10-31 DIAGNOSIS — Z23 Encounter for immunization: Secondary | ICD-10-CM | POA: Diagnosis not present

## 2020-10-31 DIAGNOSIS — R456 Violent behavior: Secondary | ICD-10-CM | POA: Diagnosis present

## 2020-10-31 DIAGNOSIS — F1910 Other psychoactive substance abuse, uncomplicated: Secondary | ICD-10-CM | POA: Diagnosis not present

## 2020-10-31 DIAGNOSIS — Z765 Malingerer [conscious simulation]: Secondary | ICD-10-CM | POA: Diagnosis not present

## 2020-10-31 DIAGNOSIS — Y999 Unspecified external cause status: Secondary | ICD-10-CM | POA: Insufficient documentation

## 2020-10-31 DIAGNOSIS — I10 Essential (primary) hypertension: Secondary | ICD-10-CM | POA: Insufficient documentation

## 2020-10-31 DIAGNOSIS — Z79899 Other long term (current) drug therapy: Secondary | ICD-10-CM | POA: Insufficient documentation

## 2020-10-31 DIAGNOSIS — Z7984 Long term (current) use of oral hypoglycemic drugs: Secondary | ICD-10-CM | POA: Insufficient documentation

## 2020-10-31 DIAGNOSIS — R519 Headache, unspecified: Secondary | ICD-10-CM | POA: Insufficient documentation

## 2020-10-31 DIAGNOSIS — S60511A Abrasion of right hand, initial encounter: Secondary | ICD-10-CM | POA: Diagnosis not present

## 2020-10-31 DIAGNOSIS — F17210 Nicotine dependence, cigarettes, uncomplicated: Secondary | ICD-10-CM | POA: Insufficient documentation

## 2020-10-31 DIAGNOSIS — F259 Schizoaffective disorder, unspecified: Secondary | ICD-10-CM | POA: Diagnosis not present

## 2020-10-31 DIAGNOSIS — W134XXA Fall from, out of or through window, initial encounter: Secondary | ICD-10-CM | POA: Diagnosis not present

## 2020-10-31 DIAGNOSIS — Y9389 Activity, other specified: Secondary | ICD-10-CM | POA: Diagnosis not present

## 2020-10-31 DIAGNOSIS — E119 Type 2 diabetes mellitus without complications: Secondary | ICD-10-CM | POA: Insufficient documentation

## 2020-10-31 DIAGNOSIS — Y9289 Other specified places as the place of occurrence of the external cause: Secondary | ICD-10-CM | POA: Diagnosis not present

## 2020-10-31 DIAGNOSIS — R45851 Suicidal ideations: Secondary | ICD-10-CM | POA: Insufficient documentation

## 2020-10-31 MED ORDER — TETANUS-DIPHTH-ACELL PERTUSSIS 5-2.5-18.5 LF-MCG/0.5 IM SUSY
0.5000 mL | PREFILLED_SYRINGE | Freq: Once | INTRAMUSCULAR | Status: AC
  Administered 2020-10-31: 0.5 mL via INTRAMUSCULAR
  Filled 2020-10-31: qty 0.5
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
## 2020-10-31 NOTE — BH Assessment (Signed)
Called cart #1 and it's working. Night time TTS worker will complete this patient's assessment.

## 2020-10-31 NOTE — ED Notes (Signed)
TTS machine at bedside.  Awaiting their call.

## 2020-10-31 NOTE — ED Notes (Signed)
Notified by PA that pt states she wants to slit her wrists.
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
## 2020-10-31 NOTE — BH Assessment (Addendum)
Calling TTS cart #2. No answer.

## 2020-10-31 NOTE — ED Notes (Signed)
Pt snoring in hallway.

## 2020-10-31 NOTE — BH Assessment (Addendum)
Requested TTS machine to be set up. Called cart but no connection. Nursing notified.
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
## 2020-11-01 DIAGNOSIS — Z765 Malingerer [conscious simulation]: Secondary | ICD-10-CM

## 2020-11-01 MED ORDER — HALOPERIDOL 5 MG PO TABS: 10.0000 mg | ORAL_TABLET | Freq: Every day | ORAL | Status: DC

## 2020-11-01 MED ORDER — HALOPERIDOL 10 MG PO TABS: 10.0000 mg | ORAL_TABLET | Freq: Every day | ORAL | 0 refills | Status: AC

## 2020-11-01 MED ORDER — TRAZODONE HCL 150 MG PO TABS: 300.0000 mg | ORAL_TABLET | Freq: Every day | ORAL | 0 refills | Status: AC

## 2020-11-01 MED ORDER — BENZTROPINE MESYLATE 0.5 MG PO TABS: 0.5000 mg | ORAL_TABLET | Freq: Two times a day (BID) | ORAL | 0 refills | Status: AC

## 2020-11-01 NOTE — ED Notes (Signed)
This RN notified by Lelon Mast RN of occurrence that occurred when pt was being assessed by provider via tele psych machine. This RN notified psych provider Marciano Sequin NP of occurrence

## 2020-11-01 NOTE — ED Notes (Signed)
1 personal property bag locked in locker #1

## 2020-11-01 NOTE — ED Notes (Signed)
Lunch Tray Ordered @ 1034. 

## 2020-11-01 NOTE — ED Notes (Signed)
Tele psych machine to bedside  
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
## 2020-11-01 NOTE — ED Notes (Signed)
Was made aware from sitter that patient was getting loud while talking to the tele psych via computer. Patient noted attempting to turn on machine and taking the cord that was attached to the Ipad and wrapping it around her neck screaming she would just kill herself. I told the cord from around her neck and there sitter removed the machine from the room. Attempted to talk and calm patient down however she was upset that she was going to [facility].
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
## 2020-11-01 NOTE — ED Notes (Signed)
This RN attempted to get vitals from pt, pt sat up on stretcher and stated becoming verbally abusive with staff, refusing vitals and stating " Ya'll need to shut up".This RN spoke to pt in regards to language, pt proceeded to lay back down and close eyes.

## 2020-11-01 NOTE — Discharge Instructions (Signed)
You have been evaluated medically and psychiatrically and have been found safe to discharge home at this time.   You should continue to take your behavioral health medications as prescribed.

## 2020-11-01 NOTE — ED Notes (Signed)
DC instructions reviewed with guardian.  Understanding was verbalized.  PT DC.

## 2020-11-01 NOTE — ED Notes (Signed)
Pt taking a shower 

## 2020-11-01 NOTE — ED Notes (Signed)
Patient refusing vital signs at this time.

## 2020-12-12 ENCOUNTER — Other Ambulatory Visit: Payer: Self-pay

## 2020-12-12 ENCOUNTER — Telehealth (HOSPITAL_COMMUNITY): Payer: Medicare Other
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
